# Patient Record
Sex: Female | Born: 1990 | Race: Black or African American | Hispanic: No | Marital: Married | Smoking: Never smoker
Health system: Southern US, Community
[De-identification: ages and names within clinical notes are randomized; demographics above are authoritative.]

## PROBLEM LIST (undated history)

## (undated) DIAGNOSIS — D649 Anemia, unspecified: Secondary | ICD-10-CM

## (undated) DIAGNOSIS — G039 Meningitis, unspecified: Secondary | ICD-10-CM

---
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
## 2012-02-24 ENCOUNTER — Emergency Department: Payer: Self-pay | Admitting: Emergency Medicine

## 2012-02-24 LAB — CBC WITH DIFFERENTIAL/PLATELET
Basophil #: 0 10*3/uL (ref 0.0–0.1)
Basophil %: 0.6 %
Eosinophil #: 0.1 10*3/uL (ref 0.0–0.7)
Eosinophil %: 1 %
HGB: 11.5 g/dL — ABNORMAL LOW (ref 12.0–16.0)
Lymphocyte %: 24.7 %
MCH: 22.7 pg — ABNORMAL LOW (ref 26.0–34.0)
MCHC: 32.3 g/dL (ref 32.0–36.0)
MCV: 70 fL — ABNORMAL LOW (ref 80–100)
Monocyte %: 6.9 %
Neutrophil #: 5.4 10*3/uL (ref 1.4–6.5)
Platelet: 421 10*3/uL (ref 150–440)
RBC: 5.05 10*6/uL (ref 3.80–5.20)
RDW: 16.7 % — ABNORMAL HIGH (ref 11.5–14.5)

## 2012-02-24 LAB — URINALYSIS, COMPLETE
Bacteria: NONE SEEN
Bilirubin,UR: NEGATIVE
Blood: NEGATIVE
Glucose,UR: NEGATIVE mg/dL (ref 0–75)
Ketone: NEGATIVE
Nitrite: NEGATIVE
RBC,UR: 2 /HPF (ref 0–5)
WBC UR: 4 /HPF (ref 0–5)

## 2012-02-24 LAB — COMPREHENSIVE METABOLIC PANEL
Albumin: 4.4 g/dL (ref 3.8–5.6)
Anion Gap: 8 (ref 7–16)
Chloride: 107 mmol/L (ref 98–107)
Potassium: 3.8 mmol/L (ref 3.5–5.1)
SGOT(AST): 26 U/L (ref 0–26)
SGPT (ALT): 21 U/L
Sodium: 142 mmol/L (ref 136–145)

## 2012-02-24 LAB — LIPASE, BLOOD: Lipase: 129 U/L (ref 73–393)

## 2012-10-26 ENCOUNTER — Emergency Department: Payer: Self-pay | Admitting: Emergency Medicine

## 2012-10-26 LAB — HCG, QUANTITATIVE, PREGNANCY: Beta Hcg, Quant.: 2080 m[IU]/mL — ABNORMAL HIGH

## 2012-10-26 LAB — COMPREHENSIVE METABOLIC PANEL
Anion Gap: 6 — ABNORMAL LOW (ref 7–16)
BUN: 8 mg/dL (ref 7–18)
Bilirubin,Total: 0.5 mg/dL (ref 0.2–1.0)
Calcium, Total: 8.8 mg/dL (ref 8.5–10.1)
Chloride: 105 mmol/L (ref 98–107)
Creatinine: 0.69 mg/dL (ref 0.60–1.30)
EGFR (African American): >60
EGFR (Non-African Amer.): >60
Glucose: 90 mg/dL (ref 65–99)
Potassium: 3.6 mmol/L (ref 3.5–5.1)
Sodium: 136 mmol/L (ref 136–145)

## 2012-10-26 LAB — URINALYSIS, COMPLETE
Bacteria: NONE SEEN
Glucose,UR: NEGATIVE mg/dL (ref 0–75)
Nitrite: NEGATIVE
Ph: 6 (ref 4.5–8.0)
Protein: NEGATIVE
RBC,UR: 2 /HPF (ref 0–5)
Specific Gravity: 1.014 (ref 1.003–1.030)
Squamous Epithelial: 7
WBC UR: 2 /HPF (ref 0–5)

## 2012-10-26 LAB — CBC
HGB: 10.7 g/dL — ABNORMAL LOW (ref 12.0–16.0)
MCHC: 31 g/dL — ABNORMAL LOW (ref 32.0–36.0)
MCV: 68 fL — ABNORMAL LOW (ref 80–100)
Platelet: 504 10*3/uL — ABNORMAL HIGH (ref 150–440)
RBC: 5.09 10*6/uL (ref 3.80–5.20)
WBC: 10 10*3/uL (ref 3.6–11.0)

## 2012-10-27 LAB — WET PREP, GENITAL

## 2015-04-05 ENCOUNTER — Encounter: Payer: Self-pay | Admitting: *Deleted

## 2015-04-05 ENCOUNTER — Emergency Department: Payer: 59

## 2015-04-05 ENCOUNTER — Emergency Department
Admission: EM | Admit: 2015-04-05 | Discharge: 2015-04-05 | Disposition: A | Discharge status: Home / Self Care | Payer: 59 | Attending: Emergency Medicine | Admitting: Emergency Medicine

## 2015-04-05 DIAGNOSIS — Z79899 Other long term (current) drug therapy: Secondary | ICD-10-CM | POA: Diagnosis not present

## 2015-04-05 DIAGNOSIS — Z3A01 Less than 8 weeks gestation of pregnancy: Secondary | ICD-10-CM | POA: Insufficient documentation

## 2015-04-05 DIAGNOSIS — O200 Threatened abortion: Secondary | ICD-10-CM | POA: Insufficient documentation

## 2015-04-05 DIAGNOSIS — N760 Acute vaginitis: Secondary | ICD-10-CM

## 2015-04-05 DIAGNOSIS — O23591 Infection of other part of genital tract in pregnancy, first trimester: Secondary | ICD-10-CM | POA: Insufficient documentation

## 2015-04-05 DIAGNOSIS — O9989 Other specified diseases and conditions complicating pregnancy, childbirth and the puerperium: Secondary | ICD-10-CM | POA: Diagnosis present

## 2015-04-05 DIAGNOSIS — B9689 Other specified bacterial agents as the cause of diseases classified elsewhere: Secondary | ICD-10-CM

## 2015-04-05 LAB — POCT PREGNANCY, URINE: Preg Test, Ur: POSITIVE — AB

## 2015-04-05 LAB — URINALYSIS COMPLETE WITH MICROSCOPIC (ARMC ONLY)
BACTERIA UA: NONE SEEN
Bilirubin Urine: NEGATIVE
Glucose, UA: NEGATIVE mg/dL
HGB URINE DIPSTICK: NEGATIVE
Ketones, ur: NEGATIVE mg/dL
LEUKOCYTES UA: NEGATIVE
Nitrite: NEGATIVE
Protein, ur: NEGATIVE mg/dL
SPECIFIC GRAVITY, URINE: 1.013 (ref 1.005–1.030)
pH: 8 (ref 5.0–8.0)

## 2015-04-05 LAB — CHLAMYDIA/NGC RT PCR (ARMC ONLY)
Chlamydia Tr: NOT DETECTED
N GONORRHOEAE: NOT DETECTED

## 2015-04-05 LAB — WET PREP, GENITAL
TRICH WET PREP: NONE SEEN
YEAST WET PREP: NONE SEEN

## 2015-04-05 LAB — HCG, QUANTITATIVE, PREGNANCY: hCG, Beta Chain, Quant, S: 33105 m[IU]/mL — ABNORMAL HIGH (ref <5)

## 2015-04-05 LAB — ABO/RH: ABO/RH(D): O POS

## 2015-04-05 MED ORDER — METRONIDAZOLE 500 MG PO TABS: 500.0000 mg | ORAL_TABLET | Freq: Two times a day (BID) | ORAL | Status: AC

## 2015-04-05 MED ORDER — METRONIDAZOLE 500 MG PO TABS
500.0000 mg | ORAL_TABLET | Freq: Once | ORAL | Status: AC
  Administered 2015-04-05: 500 mg via ORAL

## 2015-04-05 MED ORDER — METRONIDAZOLE 500 MG PO TABS
ORAL_TABLET | ORAL | Status: AC
  Administered 2015-04-05: 500 mg via ORAL
  Filled 2015-04-05: qty 1

## 2015-04-05 NOTE — ED Notes (Signed)
Patient transported to Ultrasound 

## 2015-04-05 NOTE — ED Notes (Signed)
POC pregnancy positive.

## 2015-04-05 NOTE — ED Notes (Signed)
LMP 5/6.  Pt is [redacted] weeks pregnant and for the past 3 days pt has been spotting and today she has been having cramping.  Cramping increases with activity.  Pt states that she has been having spotting, changing her panty liner about every 2 hours.  Spotting is intermittent

## 2015-04-05 NOTE — ED Provider Notes (Signed)
[facility] Emergency Department Provider Note  ____________________________________________  Time seen: Approximately 725 PM  I have reviewed the triage vital signs and the nursing notes.   HISTORY  Chief Complaint Threatened Miscarriage    HPI Kathryn Frost is a 24 y.o. female who is a G2 P1 who presents today with 3 days of mild lower abdominal cramping with vaginal spotting. She says that she is about 7 weeks' pregnant and will be having obstetric care with [facility]. She says she has an appointment in one week and is already taking prenatal vitamins. She has a history of chlamydia that was treated this past February. No history of ectopic pregnancy. Not passing any tissue. No complaints of vaginal discharge or dysuria. No cramping at this time.   History reviewed. No pertinent past medical history.  There are no active problems to display for this patient.   History reviewed. No pertinent past surgical history.  Current Outpatient Rx  Name  Route  Sig  Dispense  Refill  . prenatal vitamin w/FE, FA (PRENATAL 1 + 1) 27-1 MG TABS tablet   Oral   Take 1 tablet by mouth daily.           Allergies Review of patient's allergies indicates no known allergies.  No family history on file.  Social History History  Substance Use Topics  . Smoking status: Never Smoker   . Smokeless tobacco: Not on file  . Alcohol Use: No    Review of Systems Constitutional: No fever/chills Eyes: No visual changes. ENT: No sore throat. Cardiovascular: Denies chest pain. Respiratory: Denies shortness of breath. Gastrointestinal: No abdominal pain.  No nausea, no vomiting.  No diarrhea.  No constipation. Genitourinary: Negative for dysuria. Musculoskeletal: Negative for back pain. Skin: Negative for rash. Neurological: Negative for headaches, focal weakness or numbness.  10-point ROS otherwise  negative.  ____________________________________________   PHYSICAL EXAM:  VITAL SIGNS: ED Triage Vitals  Enc Vitals Group     BP 04/05/15 1729 102/58 mmHg     Pulse Rate 04/05/15 1729 71     Resp 04/05/15 1729 20     Temp 04/05/15 1729 98.1 F (36.7 C)     Temp Source 04/05/15 1729 Oral     SpO2 04/05/15 1729 100 %     Weight 04/05/15 1729 130 lb (58.968 kg)     Height 04/05/15 1729  (1.702 m)     Head Cir --      Peak Flow --      Pain Score 04/05/15 1730 4     Pain Loc --      Pain Edu? --      Excl. in GC? --     Constitutional: Alert and oriented. Well appearing and in no acute distress. Eyes: Conjunctivae are normal. PERRL. EOMI. Head: Atraumatic. Nose: No congestion/rhinnorhea. Mouth/Throat: Mucous membranes are moist.  Oropharynx non-erythematous. Neck: No stridor.   Cardiovascular: Normal rate, regular rhythm. Grossly normal heart sounds.  Good peripheral circulation. Respiratory: Normal respiratory effort.  No retractions. Lungs CTAB. Gastrointestinal: Soft and nontender. No distention. No abdominal bruits. No CVA tenderness. Genitourinary: Normal external exam. Speculum exam with small amount of yellow discharge. Bimanual exam with closed cervix. There is no CMT. There is no uterine or adnexal tenderness. There are no masses palpated. Musculoskeletal: No lower extremity tenderness nor edema.  No joint effusions. Neurologic:  Normal speech and language. No gross focal neurologic deficits are appreciated. Speech is normal. No gait instability. Skin:  Skin is warm, dry and intact. No rash noted. Psychiatric: Mood and affect are normal. Speech and behavior are normal.  ____________________________________________   LABS (all labs ordered are listed, but only abnormal results are displayed)  Labs Reviewed  WET PREP, GENITAL - Abnormal; Notable for the following:    Clue Cells Wet Prep HPF POC FEW (*)    WBC, Wet Prep HPF POC MODERATE (*)    All other  components within normal limits  HCG, QUANTITATIVE, PREGNANCY - Abnormal; Notable for the following:    hCG, Beta Chain, Quant, S 33105 (*)    All other components within normal limits  URINALYSIS COMPLETEWITH MICROSCOPIC ([facility] ONLY) - Abnormal; Notable for the following:    Color, Urine YELLOW (*)    APPearance CLEAR (*)    Squamous Epithelial / LPF 0-5 (*)    All other components within normal limits  POCT PREGNANCY, URINE - Abnormal; Notable for the following:    Preg Test, Ur POSITIVE (*)    All other components within normal limits  CHLAMYDIA/NGC RT PCR ([facility] ONLY)  POC URINE PREG, ED  ABO/RH  ABO/RH   ____________________________________________  EKG   ____________________________________________  RADIOLOGY  Single live intrauterine gestation 6 weeks and 6 days ____________________________________________   PROCEDURES    ____________________________________________   INITIAL IMPRESSION / ASSESSMENT AND PLAN / ED COURSE  Pertinent labs & imaging results that were available during my care of the patient were reviewed by me and considered in my medical decision making (see chart for details).  ----------------------------------------- 9:50 PM on 04/05/2015 -----------------------------------------  Patient resting comfortably. We'll discharge to home. Patient to continue prenatal vitamins. Will follow-up at [facility] this Wednesday for her prenatal care. We will treat bacterial vaginosis with Flagyl. Patient counseled and has had BV 1 time in the past as well. ____________________________________________   FINAL CLINICAL IMPRESSION(S) / ED DIAGNOSES  Acute threatened abortion. Acute bacterial vaginosis. Initial visit.    Myrna Blazer, MD 04/05/15 2150

## 2015-04-06 LAB — ABO/RH: ABO/RH(D): O POS

## 2017-12-21 ENCOUNTER — Emergency Department: Payer: 59

## 2017-12-21 ENCOUNTER — Encounter: Payer: Self-pay | Admitting: Emergency Medicine

## 2017-12-21 ENCOUNTER — Emergency Department
Admission: EM | Admit: 2017-12-21 | Discharge: 2017-12-21 | Disposition: A | Discharge status: Home / Self Care | Payer: 59 | Attending: Emergency Medicine | Admitting: Emergency Medicine

## 2017-12-21 ENCOUNTER — Other Ambulatory Visit: Payer: Self-pay

## 2017-12-21 DIAGNOSIS — R509 Fever, unspecified: Secondary | ICD-10-CM | POA: Diagnosis present

## 2017-12-21 DIAGNOSIS — J020 Streptococcal pharyngitis: Secondary | ICD-10-CM

## 2017-12-21 LAB — GROUP A STREP BY PCR: Group A Strep by PCR: DETECTED — AB

## 2017-12-21 LAB — POCT PREGNANCY, URINE: PREG TEST UR: NEGATIVE

## 2017-12-21 LAB — INFLUENZA PANEL BY PCR (TYPE A & B)
INFLBPCR: NEGATIVE
Influenza A By PCR: NEGATIVE

## 2017-12-21 LAB — URINALYSIS, COMPLETE (UACMP) WITH MICROSCOPIC
BACTERIA UA: NONE SEEN
Bilirubin Urine: NEGATIVE
Glucose, UA: NEGATIVE mg/dL
Hgb urine dipstick: NEGATIVE
Ketones, ur: NEGATIVE mg/dL
Nitrite: NEGATIVE
Protein, ur: NEGATIVE mg/dL
SPECIFIC GRAVITY, URINE: 1.019 (ref 1.005–1.030)
pH: 6 (ref 5.0–8.0)

## 2017-12-21 MED ORDER — ONDANSETRON 4 MG PO TBDP: 4.0000 mg | ORAL_TABLET | Freq: Three times a day (TID) | ORAL | 0 refills | Status: DC | PRN

## 2017-12-21 MED ORDER — TRAMADOL HCL 50 MG PO TABS: 50.0000 mg | ORAL_TABLET | Freq: Four times a day (QID) | ORAL | 0 refills | Status: DC | PRN

## 2017-12-21 MED ORDER — IBUPROFEN 800 MG PO TABS
800.0000 mg | ORAL_TABLET | Freq: Once | ORAL | Status: AC
  Administered 2017-12-21: 800 mg via ORAL
  Filled 2017-12-21: qty 1

## 2017-12-21 MED ORDER — AMOXICILLIN 875 MG PO TABS: 875.0000 mg | ORAL_TABLET | Freq: Two times a day (BID) | ORAL | 0 refills | Status: DC

## 2017-12-21 MED ORDER — ONDANSETRON 4 MG PO TBDP
4.0000 mg | ORAL_TABLET | Freq: Once | ORAL | Status: AC
  Administered 2017-12-21: 4 mg via ORAL
  Filled 2017-12-21: qty 1

## 2017-12-21 NOTE — ED Notes (Signed)
See triage note  States she developed body aches,fever and headache about 1 1/2 weeks ago  sxs' eased off and then returned on Friday  Pt is febrile on arrival

## 2017-12-21 NOTE — ED Provider Notes (Signed)
[facility] Emergency Department Provider Note  ____________________________________________   First MD Initiated Contact with Patient 12/21/17 1045     (approximate)  I have reviewed the triage vital signs and the nursing notes.   HISTORY  Chief Complaint Chills; sinus congestion; and Back Pain    HPI Kathryn Frost is a 27 y.o. female resents emergency department complaining of fever and chills with body aches, headache for about 1-1/2 weeks.  She states she had same symptoms last week but tested negative for the flu.  Now she has a cough.,  Sore throat, nausea, and diarrhea.  She states she did have some urinary frequency and burning this morning.  She states she is otherwise healthy.  She states all of her children and her husband both had the flu  History reviewed. No pertinent past medical history.  There are no active problems to display for this patient.   History reviewed. No pertinent surgical history.  Prior to Admission medications   Medication Sig Start Date End Date Taking? Authorizing Provider  amoxicillin (AMOXIL) 875 MG tablet Take 1 tablet (875 mg total) by mouth 2 (two) times daily. 12/21/17   Fisher, Roselyn Bering, PA-C  ondansetron (ZOFRAN-ODT) 4 MG disintegrating tablet Take 1 tablet (4 mg total) by mouth every 8 (eight) hours as needed for nausea or vomiting. 12/21/17   Sherrie Mustache Roselyn Bering, PA-C  traMADol (ULTRAM) 50 MG tablet Take 1 tablet (50 mg total) by mouth every 6 (six) hours as needed. 12/21/17   Faythe Ghee, PA-C    Allergies Patient has no known allergies.  No family history on file.  Social History Social History   Tobacco Use  . Smoking status: Never Smoker  . Smokeless tobacco: Never Used  Substance Use Topics  . Alcohol use: No  . Drug use: Not on file    Review of Systems  Constitutional: Positive fever/chills Eyes: No visual changes. ENT: Positive sore throat. Respiratory: Positive cough Gastrointestinal:  Positive for nausea, positive for diarrhea Genitourinary: Positive for dysuria. Musculoskeletal: Positive for back pain. Skin: Negative for rash.    ____________________________________________   PHYSICAL EXAM:  VITAL SIGNS: ED Triage Vitals  Enc Vitals Group     BP 12/21/17 1028 125/66     Pulse Rate 12/21/17 1028 89     Resp 12/21/17 1028 18     Temp 12/21/17 1028 (!) 101.1 F (38.4 C)     Temp Source 12/21/17 1028 Oral     SpO2 12/21/17 1028 99 %     Weight 12/21/17 1027 132 lb (59.9 kg)     Height 12/21/17 1027 5\' 7"  (1.702 m)     Head Circumference --      Peak Flow --      Pain Score 12/21/17 1027 7     Pain Loc --      Pain Edu? --      Excl. in GC? --     Constitutional: Alert and oriented.  Tired appearing and in no acute distress.  Nontoxic Eyes: Conjunctivae are normal.  Head: Atraumatic. Ears: TMs clear bilaterally Nose: No congestion/rhinnorhea. Mouth/Throat: Mucous membranes are moist.  Throat with enlarged tonsils Cardiovascular: Normal rate, regular rhythm.  Sounds are normal Respiratory: Normal respiratory effort.  No retractions, lungs clear to auscultation Abdomen: Soft nontender bowel sounds normal GU: deferred Musculoskeletal: FROM all extremities, warm and well perfused.  The mid back is tender around the ribs.  Positive CVA tenderness Neurologic:  Normal speech and  language.  Skin:  Skin is warm, dry and intact. No rash noted. Psychiatric: Mood and affect are normal. Speech and behavior are normal.  ____________________________________________   LABS (all labs ordered are listed, but only abnormal results are displayed)  Labs Reviewed  GROUP A STREP BY PCR - Abnormal; Notable for the following components:      Result Value   Group A Strep by PCR DETECTED (*)    All other components within normal limits  URINALYSIS, COMPLETE (UACMP) WITH MICROSCOPIC - Abnormal; Notable for the following components:   Color, Urine YELLOW (*)     APPearance CLOUDY (*)    Leukocytes, UA TRACE (*)    Squamous Epithelial / LPF TOO NUMEROUS TO COUNT (*)    All other components within normal limits  INFLUENZA PANEL BY PCR (TYPE A & B)  POC URINE PREG, ED  POCT PREGNANCY, URINE   ____________________________________________   ____________________________________________  RADIOLOGY  Chest x-ray is negative for pneumonia  ____________________________________________   PROCEDURES  Procedure(s) performed: No  Procedures    ____________________________________________   INITIAL IMPRESSION / ASSESSMENT AND PLAN / ED COURSE  Pertinent labs & imaging results that were available during my care of the patient were reviewed by me and considered in my medical decision making (see chart for details).  Patient has a 27 year old female complaining of flulike symptoms with a headache, cough and dysuria, some nausea and diarrhea  On physical exam the patient appears ill but nontoxic she is febrile.  Lungs with decreased breath sounds.  Abdomen is soft nontender  Flu test, strep test, UA, chest x-ray    ----------------------------------------- 3:48 PM on 12/21/2017 -----------------------------------------  Chest x-ray is negative, flu test is negative, strep test is positive, UA is negative  X-ray lab results were discussed with the patient.  She was given a prescription for amoxicillin 875 twice daily for 10 days.  She is to follow-up with her regular doctor if not better in 3-5 days.  She take Tylenol and ibuprofen as needed for pain and fever.  She is to drink plenty of fluids.  Patient states she understands comply with instructions.  She was discharged in stable condition  As part of my medical decision making, I reviewed the following data within the electronic MEDICAL RECORD NUMBER Nursing notes reviewed and incorporated, Labs reviewed labs as above, Radiograph reviewed chest x-ray is negative, Notes from prior ED visits and [place]  Controlled Substance Database  ____________________________________________   FINAL CLINICAL IMPRESSION(S) / ED DIAGNOSES  Final diagnoses:  Strep throat      NEW MEDICATIONS STARTED DURING THIS VISIT:  Discharge Medication List as of 12/21/2017 12:23 PM    START taking these medications   Details  amoxicillin (AMOXIL) 875 MG tablet Take 1 tablet (875 mg total) by mouth 2 (two) times daily., Starting Mon 12/21/2017, Print    ondansetron (ZOFRAN-ODT) 4 MG disintegrating tablet Take 1 tablet (4 mg total) by mouth every 8 (eight) hours as needed for nausea or vomiting., Starting Mon 12/21/2017, Print    traMADol (ULTRAM) 50 MG tablet Take 1 tablet (50 mg total) by mouth every 6 (six) hours as needed., Starting Mon 12/21/2017, Print         Note:  This document was prepared using Dragon voice recognition software and may include unintentional dictation errors.    Faythe GheeFisher, Susan W, PA-C 12/21/17 1549    Jene EveryKinner, Robert, MD 12/22/17 [id number]

## 2017-12-21 NOTE — ED Triage Notes (Signed)
C/o chills, congestion, back pain since Friday. States had similar symptoms last week, but resolved.

## 2017-12-21 NOTE — Discharge Instructions (Signed)
Follow-up with your regular doctor if not better in 3 days.  Use medication as prescribed.  Drink plenty of fluids.  Be sure to finish all of the antibiotic as it will take 10 days to clear out strep.  You should not return to work until he been fever free for 24 hours.  Discard your toothbrush in 3 days so you will not reinfect yourself.  If you are worsening and cannot swallow liquids please return the emergency department

## 2019-05-06 ENCOUNTER — Other Ambulatory Visit: Payer: Self-pay

## 2019-05-06 ENCOUNTER — Emergency Department
Admission: EM | Admit: 2019-05-06 | Discharge: 2019-05-06 | Disposition: A | Discharge status: Home / Self Care | Payer: BC Managed Care – PPO | Attending: Emergency Medicine | Admitting: Emergency Medicine

## 2019-05-06 ENCOUNTER — Encounter: Payer: Self-pay | Admitting: Emergency Medicine

## 2019-05-06 DIAGNOSIS — R51 Headache: Secondary | ICD-10-CM | POA: Diagnosis present

## 2019-05-06 DIAGNOSIS — Z20828 Contact with and (suspected) exposure to other viral communicable diseases: Secondary | ICD-10-CM | POA: Diagnosis not present

## 2019-05-06 DIAGNOSIS — R110 Nausea: Secondary | ICD-10-CM | POA: Diagnosis not present

## 2019-05-06 LAB — URINALYSIS, COMPLETE (UACMP) WITH MICROSCOPIC
Bacteria, UA: NONE SEEN
Bilirubin Urine: NEGATIVE
Glucose, UA: NEGATIVE mg/dL
Ketones, ur: NEGATIVE mg/dL
Leukocytes,Ua: NEGATIVE
Nitrite: NEGATIVE
Protein, ur: NEGATIVE mg/dL
Specific Gravity, Urine: 1.025 (ref 1.005–1.030)
pH: 6 (ref 5.0–8.0)

## 2019-05-06 LAB — COMPREHENSIVE METABOLIC PANEL
ALT: 16 U/L (ref 0–44)
AST: 21 U/L (ref 15–41)
Albumin: 4.2 g/dL (ref 3.5–5.0)
Alkaline Phosphatase: 60 U/L (ref 38–126)
Anion gap: 7 (ref 5–15)
BUN: 14 mg/dL (ref 6–20)
CO2: 25 mmol/L (ref 22–32)
Calcium: 8.7 mg/dL — ABNORMAL LOW (ref 8.9–10.3)
Chloride: 106 mmol/L (ref 98–111)
Creatinine, Ser: 0.72 mg/dL (ref 0.44–1.00)
GFR calc Af Amer: >60 mL/min (ref >60)
GFR calc non Af Amer: >60 mL/min (ref >60)
Glucose, Bld: 98 mg/dL (ref 70–99)
Potassium: 4 mmol/L (ref 3.5–5.1)
Sodium: 138 mmol/L (ref 135–145)
Total Bilirubin: 0.7 mg/dL (ref 0.3–1.2)
Total Protein: 7.7 g/dL (ref 6.5–8.1)

## 2019-05-06 LAB — CBC
HCT: 36.9 % (ref 36.0–46.0)
Hemoglobin: 12.2 g/dL (ref 12.0–15.0)
MCH: 28.8 pg (ref 26.0–34.0)
MCHC: 33.1 g/dL (ref 30.0–36.0)
MCV: 87.2 fL (ref 80.0–100.0)
Platelets: 316 10*3/uL (ref 150–400)
RBC: 4.23 MIL/uL (ref 3.87–5.11)
RDW: 13.4 % (ref 11.5–15.5)
WBC: 6.3 10*3/uL (ref 4.0–10.5)
nRBC: 0 % (ref 0.0–0.2)

## 2019-05-06 LAB — LIPASE, BLOOD: Lipase: 36 U/L (ref 11–51)

## 2019-05-06 LAB — POCT PREGNANCY, URINE: Preg Test, Ur: NEGATIVE

## 2019-05-06 MED ORDER — FAMOTIDINE 20 MG PO TABS: 20.0000 mg | ORAL_TABLET | Freq: Two times a day (BID) | ORAL | 0 refills | Status: DC

## 2019-05-06 MED ORDER — SODIUM CHLORIDE 0.9% FLUSH: 3.0000 mL | Freq: Once | INTRAVENOUS | Status: DC

## 2019-05-06 MED ORDER — ONDANSETRON 4 MG PO TBDP
8.0000 mg | ORAL_TABLET | Freq: Once | ORAL | Status: AC
  Administered 2019-05-06: 18:00:00 8 mg via ORAL
  Filled 2019-05-06: qty 2

## 2019-05-06 MED ORDER — ONDANSETRON 4 MG PO TBDP: 4.0000 mg | ORAL_TABLET | Freq: Three times a day (TID) | ORAL | 0 refills | Status: DC | PRN

## 2019-05-06 NOTE — ED Triage Notes (Signed)
Pt to ED with c/o of headache, abdominal pain and nausea for approx 1 week. Pt state diarrhea on Monday but denies vomiting.

## 2019-05-06 NOTE — ED Notes (Signed)
Pt given ginger ale.

## 2019-05-06 NOTE — Discharge Instructions (Signed)
Person Under Monitoring Name: Kathryn Frost  Location: [place] [address]   Infection Prevention Recommendations for Individuals Confirmed to have, or Being Evaluated for, 2019 Novel Coronavirus (COVID-19) Infection Who Receive Care at Home  Individuals who are confirmed to have, or are being evaluated for, COVID-19 should follow the prevention steps below until a healthcare provider or local or state health department says they can return to normal activities.  Stay home except to get medical care You should restrict activities outside your home, except for getting medical care. Do not go to work, school, or public areas, and do not use public transportation or taxis.  Call ahead before visiting your doctor Before your medical appointment, call the healthcare provider and tell them that you have, or are being evaluated for, COVID-19 infection. This will help the healthcare providers office take steps to keep other people from getting infected. Ask your healthcare provider to call the local or state health department.  Monitor your symptoms Seek prompt medical attention if your illness is worsening (e.g., difficulty breathing). Before going to your medical appointment, call the healthcare provider and tell them that you have, or are being evaluated for, COVID-19 infection. Ask your healthcare provider to call the local or state health department.  Wear a facemask You should wear a facemask that covers your nose and mouth when you are in the same room with other people and when you visit a healthcare provider. People who live with or visit you should also wear a facemask while they are in the same room with you.  Separate yourself from other people in your home As much as possible, you should stay in a different room from other people in your home. Also, you should use a separate bathroom, if available.  Avoid sharing household items You should not  share dishes, drinking glasses, cups, eating utensils, towels, bedding, or other items with other people in your home. After using these items, you should wash them thoroughly with soap and water.  Cover your coughs and sneezes Cover your mouth and nose with a tissue when you cough or sneeze, or you can cough or sneeze into your sleeve. Throw used tissues in a lined trash can, and immediately wash your hands with soap and water for at least 20 seconds or use an alcohol-based hand rub.  Wash your [facility] your hands often and thoroughly with soap and water for at least 20 seconds. You can use an alcohol-based hand sanitizer if soap and water are not available and if your hands are not visibly dirty. Avoid touching your eyes, nose, and mouth with unwashed hands.   Prevention Steps for Caregivers and Household Members of Individuals Confirmed to have, or Being Evaluated for, COVID-19 Infection Being Cared for in the Home  If you live with, or provide care at home for, a person confirmed to have, or being evaluated for, COVID-19 infection please follow these guidelines to prevent infection:  Follow healthcare providers instructions Make sure that you understand and can help the patient follow any healthcare provider instructions for all care.  Provide for the patients basic needs You should help the patient with basic needs in the home and provide support for getting groceries, prescriptions, and other personal needs.  Monitor the patients symptoms If they are getting sicker, call his or her medical provider and tell them that the patient has, or is being evaluated for, COVID-19 infection. This will help the healthcare providers  office take steps to keep other people from getting infected. Ask the healthcare provider to call the local or state health department.  Limit the number of people who have contact with the patient If possible, have only one caregiver for the  patient. Other household members should stay in another home or place of residence. If this is not possible, they should stay in another room, or be separated from the patient as much as possible. Use a separate bathroom, if available. Restrict visitors who do not have an essential need to be in the home.  Keep older adults, very young children, and other sick people away from the patient Keep older adults, very young children, and those who have compromised immune systems or chronic health conditions away from the patient. This includes people with chronic heart, lung, or kidney conditions, diabetes, and cancer.  Ensure good ventilation Make sure that shared spaces in the home have good air flow, such as from an air conditioner or an opened window, weather permitting.  Wash your hands often Wash your hands often and thoroughly with soap and water for at least 20 seconds. You can use an alcohol based hand sanitizer if soap and water are not available and if your hands are not visibly dirty. Avoid touching your eyes, nose, and mouth with unwashed hands. Use disposable paper towels to dry your hands. If not available, use dedicated cloth towels and replace them when they become wet.  Wear a facemask and gloves Wear a disposable facemask at all times in the room and gloves when you touch or have contact with the patients blood, body fluids, and/or secretions or excretions, such as sweat, saliva, sputum, nasal mucus, vomit, urine, or feces.  Ensure the mask fits over your nose and mouth tightly, and do not touch it during use. Throw out disposable facemasks and gloves after using them. Do not reuse. Wash your hands immediately after removing your facemask and gloves. If your personal clothing becomes contaminated, carefully remove clothing and launder. Wash your hands after handling contaminated clothing. Place all used disposable facemasks, gloves, and other waste in a lined container before  disposing them with other household waste. Remove gloves and wash your hands immediately after handling these items.  Do not share dishes, glasses, or other household items with the patient Avoid sharing household items. You should not share dishes, drinking glasses, cups, eating utensils, towels, bedding, or other items with a patient who is confirmed to have, or being evaluated for, COVID-19 infection. After the person uses these items, you should wash them thoroughly with soap and water.  Wash laundry thoroughly Immediately remove and wash clothes or bedding that have blood, body fluids, and/or secretions or excretions, such as sweat, saliva, sputum, nasal mucus, vomit, urine, or feces, on them. Wear gloves when handling laundry from the patient. Read and follow directions on labels of laundry or clothing items and detergent. In general, wash and dry with the warmest temperatures recommended on the label.  Clean all areas the individual has used often Clean all touchable surfaces, such as counters, tabletops, doorknobs, bathroom fixtures, toilets, phones, keyboards, tablets, and bedside tables, every day. Also, clean any surfaces that may have blood, body fluids, and/or secretions or excretions on them. Wear gloves when cleaning surfaces the patient has come in contact with. Use a diluted bleach solution (e.g., dilute bleach with 1 part bleach and 10 parts water) or a household disinfectant with a label that says EPA-registered for coronaviruses. To make a  bleach solution at home, add 1 tablespoon of bleach to 1 quart (4 cups) of water. For a larger supply, add  cup of bleach to 1 gallon (16 cups) of water. Read labels of cleaning products and follow recommendations provided on product labels. Labels contain instructions for safe and effective use of the cleaning product including precautions you should take when applying the product, such as wearing gloves or eye protection and making sure you  have good ventilation during use of the product. Remove gloves and wash hands immediately after cleaning.  Monitor yourself for signs and symptoms of illness Caregivers and household members are considered close contacts, should monitor their health, and will be asked to limit movement outside of the home to the extent possible. Follow the monitoring steps for close contacts listed on the symptom monitoring form.   ? If you have additional questions, contact your local health department or call the epidemiologist on call at [phone] (available 24/7). ? This guidance is subject to change. For the most up-to-date guidance from [facility], please refer to their website: [email or web address]

## 2019-05-06 NOTE — ED Provider Notes (Signed)
[facility] Emergency Department Provider Note  ____________________________________________  Time seen: Approximately 5:55 PM  I have reviewed the triage vital signs and the nursing notes.   HISTORY  Chief Complaint Headache and Abdominal Pain    HPI Kathryn Frost is a 28 y.o. female with no significant past medical history who complains of intermittent headache, upper abdominal discomfort, nausea for the past 5 days.  Currently resolved.   She also had a few episodes of loose stools about 3 days ago.  Denies fever chills sweats or body aches.  She has had exposure to coronavirus through work contacts as well as family contacts.  Denies chest pain or shortness of breath, no dyspnea on exertion.  No neck stiffness. she is eating okay but does have decreased appetite.     History reviewed. No pertinent past medical history.   There are no active problems to display for this patient.    History reviewed. No pertinent surgical history.   Prior to Admission medications   Medication Sig Start Date End Date Taking? Authorizing Provider  amoxicillin (AMOXIL) 875 MG tablet Take 1 tablet (875 mg total) by mouth 2 (two) times daily. 12/21/17   Fisher, Roselyn BeringSusan W, PA-C  famotidine (PEPCID) 20 MG tablet Take 1 tablet (20 mg total) by mouth 2 (two) times daily. 05/06/19   Sharman CheekStafford, Elyssia Strausser, MD  ondansetron (ZOFRAN ODT) 4 MG disintegrating tablet Take 1 tablet (4 mg total) by mouth every 8 (eight) hours as needed for nausea or vomiting. 05/06/19   Sharman CheekStafford, Nile Prisk, MD  ondansetron (ZOFRAN-ODT) 4 MG disintegrating tablet Take 1 tablet (4 mg total) by mouth every 8 (eight) hours as needed for nausea or vomiting. 12/21/17   Sherrie MustacheFisher, Roselyn BeringSusan W, PA-C  traMADol (ULTRAM) 50 MG tablet Take 1 tablet (50 mg total) by mouth every 6 (six) hours as needed. 12/21/17   Faythe GheeFisher, Susan W, PA-C     Allergies Patient has no known allergies.   History reviewed. No pertinent family  history.  Social History Social History   Tobacco Use  . Smoking status: Never Smoker  . Smokeless tobacco: Never Used  Substance Use Topics  . Alcohol use: No  . Drug use: Not on file    Review of Systems  Constitutional:   No fever or chills.  ENT:   No sore throat. No rhinorrhea. Cardiovascular:   No chest pain or syncope. Respiratory:   No dyspnea or cough. Gastrointestinal:   Positive for episodic upper abdominal pain without vomiting and diarrhea.  Musculoskeletal:   Negative for focal pain or swelling All other systems reviewed and are negative except as documented above in ROS and HPI.  ____________________________________________   PHYSICAL EXAM:  VITAL SIGNS: ED Triage Vitals  Enc Vitals Group     BP 05/06/19 1516 111/75     Pulse Rate 05/06/19 1516 72     Resp 05/06/19 1516 18     Temp 05/06/19 1516 98.3 F (36.8 C)     Temp Source 05/06/19 1516 Oral     SpO2 05/06/19 1516 96 %     Weight 05/06/19 1520 143 lb (64.9 kg)     Height 05/06/19 1520 5\' 7"  (1.702 m)     Head Circumference --      Peak Flow --      Pain Score 05/06/19 1519 8     Pain Loc --      Pain Edu? --      Excl. in GC? --  Vital signs reviewed, nursing assessments reviewed.   Constitutional:   Alert and oriented. Non-toxic appearance. Eyes:   Conjunctivae are normal. EOMI. PERRL. ENT      Head:   Normocephalic and atraumatic.         Neck:   No meningismus. Full ROM. Hematological/Lymphatic/Immunilogical:   No cervical lymphadenopathy. Cardiovascular:   RRR. Symmetric bilateral radial and DP pulses.  No murmurs. Cap refill less than 2 seconds. Respiratory:   Normal respiratory effort without tachypnea/retractions. Breath sounds are clear and equal bilaterally. No wheezes/rales/rhonchi. Gastrointestinal:   Soft and nontender. Non distended. There is no CVA tenderness.  No rebound, rigidity, or guarding.  Musculoskeletal:   Normal range of motion in all extremities. No joint  effusions.  No lower extremity tenderness.  No edema. Neurologic:   Normal speech and language.  Motor grossly intact. No acute focal neurologic deficits are appreciated.  Skin:    Skin is warm, dry and intact. No rash noted.  No petechiae, purpura, or bullae.  ____________________________________________    LABS (pertinent positives/negatives) (all labs ordered are listed, but only abnormal results are displayed) Labs Reviewed  COMPREHENSIVE METABOLIC PANEL - Abnormal; Notable for the following components:      Result Value   Calcium 8.7 (*)    All other components within normal limits  URINALYSIS, COMPLETE (UACMP) WITH MICROSCOPIC - Abnormal; Notable for the following components:   Color, Urine YELLOW (*)    APPearance CLEAR (*)    Hgb urine dipstick SMALL (*)    All other components within normal limits  NOVEL CORONAVIRUS, NAA (HOSPITAL ORDER, SEND-OUT TO REF LAB)  LIPASE, BLOOD  CBC  POC URINE PREG, ED  POCT PREGNANCY, URINE   ____________________________________________   EKG    ____________________________________________    RADIOLOGY  No results found.  ____________________________________________   PROCEDURES Procedures  ____________________________________________    CLINICAL IMPRESSION / ASSESSMENT AND PLAN / ED COURSE  Medications ordered in the ED: Medications  sodium chloride flush (NS) 0.9 % injection 3 mL (has no administration in time range)  ondansetron (ZOFRAN-ODT) disintegrating tablet 8 mg (8 mg Oral Given 05/06/19 1732)    Pertinent labs & imaging results that were available during my care of the patient were reviewed by me and considered in my medical decision making (see chart for details).  REAGEN GOATES was evaluated in Emergency Department on 05/06/2019 for the symptoms described in the history of present illness. She was evaluated in the context of the global COVID-19 pandemic, which necessitated consideration that the patient  might be at risk for infection with the SARS-CoV-2 virus that causes COVID-19. Institutional protocols and algorithms that pertain to the evaluation of patients at risk for COVID-19 are in a state of rapid change based on information released by regulatory bodies including the CDC and federal and state organizations. These policies and algorithms were followed during the patient's care in the ED.   Patient is nontoxic, vital signs are normal, exam is reassuring.  She has various symptoms which are suggestive of a possible viral illness.  We will test the patient for coronavirus, discharged home to follow-up with primary care.  Zofran and Pepcid for symptom relief at this time.Considering the patient's symptoms, medical history, and physical examination today, I have low suspicion for cholecystitis or biliary pathology, pancreatitis, perforation or bowel obstruction, hernia, intra-abdominal abscess, AAA or dissection, volvulus or intussusception, mesenteric ischemia, or appendicitis.  Doubt meningitis or encephalitis.      ____________________________________________   FINAL  CLINICAL IMPRESSION(S) / ED DIAGNOSES    Final diagnoses:  Nausea     ED Discharge Orders         Ordered    famotidine (PEPCID) 20 MG tablet  2 times daily     05/06/19 1754    ondansetron (ZOFRAN ODT) 4 MG disintegrating tablet  Every 8 hours PRN     05/06/19 1754          Portions of this note were generated with dragon dictation software. Dictation errors may occur despite best attempts at proofreading.   Sharman CheekStafford, Mikeya Tomasetti, MD 05/06/19 [phone]

## 2019-05-10 LAB — NOVEL CORONAVIRUS, NAA (HOSP ORDER, SEND-OUT TO REF LAB; TAT 18-24 HRS): SARS-CoV-2, NAA: NOT DETECTED

## 2020-01-05 ENCOUNTER — Inpatient Hospital Stay (HOSPITAL_COMMUNITY)
Admission: AD | Admit: 2020-01-05 | Discharge: 2020-01-05 | Disposition: A | Discharge status: Home / Self Care | Payer: Medicaid Other | Attending: Obstetrics and Gynecology | Admitting: Obstetrics and Gynecology

## 2020-01-05 ENCOUNTER — Other Ambulatory Visit: Payer: Self-pay

## 2020-01-05 ENCOUNTER — Encounter (HOSPITAL_COMMUNITY): Payer: Self-pay | Admitting: Obstetrics and Gynecology

## 2020-01-05 ENCOUNTER — Encounter (HOSPITAL_COMMUNITY): Payer: Self-pay

## 2020-01-05 ENCOUNTER — Ambulatory Visit (HOSPITAL_COMMUNITY)
Admission: EM | Admit: 2020-01-05 | Discharge: 2020-01-05 | Disposition: A | Discharge status: Home / Self Care | Payer: Self-pay | Attending: Family Medicine | Admitting: Family Medicine

## 2020-01-05 ENCOUNTER — Inpatient Hospital Stay (HOSPITAL_COMMUNITY): Payer: Medicaid Other

## 2020-01-05 DIAGNOSIS — O208 Other hemorrhage in early pregnancy: Secondary | ICD-10-CM | POA: Insufficient documentation

## 2020-01-05 DIAGNOSIS — M545 Low back pain, unspecified: Secondary | ICD-10-CM

## 2020-01-05 DIAGNOSIS — O468X1 Other antepartum hemorrhage, first trimester: Secondary | ICD-10-CM

## 2020-01-05 DIAGNOSIS — O26891 Other specified pregnancy related conditions, first trimester: Secondary | ICD-10-CM | POA: Diagnosis not present

## 2020-01-05 DIAGNOSIS — Z3A01 Less than 8 weeks gestation of pregnancy: Secondary | ICD-10-CM | POA: Insufficient documentation

## 2020-01-05 DIAGNOSIS — Z3490 Encounter for supervision of normal pregnancy, unspecified, unspecified trimester: Secondary | ICD-10-CM

## 2020-01-05 DIAGNOSIS — Z6790 Unspecified blood type, Rh positive: Secondary | ICD-10-CM

## 2020-01-05 DIAGNOSIS — O418X10 Other specified disorders of amniotic fluid and membranes, first trimester, not applicable or unspecified: Secondary | ICD-10-CM

## 2020-01-05 DIAGNOSIS — R109 Unspecified abdominal pain: Secondary | ICD-10-CM

## 2020-01-05 HISTORY — DX: Anemia, unspecified: D64.9

## 2020-01-05 HISTORY — DX: Meningitis, unspecified: G03.9

## 2020-01-05 LAB — COMPREHENSIVE METABOLIC PANEL
ALT: 17 U/L (ref 0–44)
AST: 21 U/L (ref 15–41)
Albumin: 3.9 g/dL (ref 3.5–5.0)
Alkaline Phosphatase: 61 U/L (ref 38–126)
Anion gap: 8 (ref 5–15)
BUN: 9 mg/dL (ref 6–20)
CO2: 23 mmol/L (ref 22–32)
Calcium: 9 mg/dL (ref 8.9–10.3)
Chloride: 108 mmol/L (ref 98–111)
Creatinine, Ser: 0.77 mg/dL (ref 0.44–1.00)
GFR calc Af Amer: >60 mL/min (ref >60)
GFR calc non Af Amer: >60 mL/min (ref >60)
Glucose, Bld: 74 mg/dL (ref 70–99)
Potassium: 3.8 mmol/L (ref 3.5–5.1)
Sodium: 139 mmol/L (ref 135–145)
Total Bilirubin: 1.1 mg/dL (ref 0.3–1.2)
Total Protein: 6.9 g/dL (ref 6.5–8.1)

## 2020-01-05 LAB — POCT URINALYSIS DIP (DEVICE)
Bilirubin Urine: NEGATIVE
Glucose, UA: NEGATIVE mg/dL
Hgb urine dipstick: NEGATIVE
Ketones, ur: NEGATIVE mg/dL
Leukocytes,Ua: NEGATIVE
Nitrite: NEGATIVE
Protein, ur: NEGATIVE mg/dL
Specific Gravity, Urine: 1.02 (ref 1.005–1.030)
Urobilinogen, UA: 0.2 mg/dL (ref 0.0–1.0)
pH: 7 (ref 5.0–8.0)

## 2020-01-05 LAB — WET PREP, GENITAL
Clue Cells Wet Prep HPF POC: NONE SEEN
Sperm: NONE SEEN
Trich, Wet Prep: NONE SEEN
Yeast Wet Prep HPF POC: NONE SEEN

## 2020-01-05 LAB — URINALYSIS, ROUTINE W REFLEX MICROSCOPIC
Bilirubin Urine: NEGATIVE
Glucose, UA: NEGATIVE mg/dL
Hgb urine dipstick: NEGATIVE
Ketones, ur: NEGATIVE mg/dL
Nitrite: NEGATIVE
Protein, ur: NEGATIVE mg/dL
Specific Gravity, Urine: 1.017 (ref 1.005–1.030)
pH: 6 (ref 5.0–8.0)

## 2020-01-05 LAB — CBC
HCT: 36.5 % (ref 36.0–46.0)
Hemoglobin: 12.2 g/dL (ref 12.0–15.0)
MCH: 28.9 pg (ref 26.0–34.0)
MCHC: 33.4 g/dL (ref 30.0–36.0)
MCV: 86.5 fL (ref 80.0–100.0)
Platelets: 294 10*3/uL (ref 150–400)
RBC: 4.22 MIL/uL (ref 3.87–5.11)
RDW: 14.1 % (ref 11.5–15.5)
WBC: 7.7 10*3/uL (ref 4.0–10.5)
nRBC: 0 % (ref 0.0–0.2)

## 2020-01-05 LAB — POCT PREGNANCY, URINE: Preg Test, Ur: POSITIVE — AB

## 2020-01-05 LAB — HCG, QUANTITATIVE, PREGNANCY: hCG, Beta Chain, Quant, S: 5260 m[IU]/mL — ABNORMAL HIGH (ref <5)

## 2020-01-05 LAB — POC URINE PREG, ED: Preg Test, Ur: POSITIVE — AB

## 2020-01-05 MED ORDER — ACETAMINOPHEN 500 MG PO TABS
1000.0000 mg | ORAL_TABLET | Freq: Once | ORAL | Status: AC
  Administered 2020-01-05: 1000 mg via ORAL
  Filled 2020-01-05: qty 2

## 2020-01-05 NOTE — Progress Notes (Signed)
GC/Chlamydia & wet prep cultures obtained & sent to lab by this RN.

## 2020-01-05 NOTE — ED Notes (Signed)
Patient states she is pregnant.

## 2020-01-05 NOTE — Discharge Instructions (Signed)
Subchorionic Hematoma  A subchorionic hematoma is a gathering of blood between the outer wall of the embryo (chorion) and the inner wall of the womb (uterus). This condition can cause vaginal bleeding. If they cause little or no vaginal bleeding, early small hematomas usually shrink on their own and do not affect your baby or pregnancy. When bleeding starts later in pregnancy, or if the hematoma is larger or occurs in older pregnant women, the condition may be more serious. Larger hematomas may get bigger, which increases the chances of miscarriage. This condition also increases the risk of:  Premature separation of the placenta from the uterus.  Premature (preterm) labor.  Stillbirth. What are the causes? The exact cause of this condition is not known. It occurs when blood is trapped between the placenta and the uterine wall because the placenta has separated from the original site of implantation. What increases the risk? You are more likely to develop this condition if:  You were treated with fertility medicines.  You conceived through in vitro fertilization (IVF). What are the signs or symptoms? Symptoms of this condition include:  Vaginal spotting or bleeding.  Contractions of the uterus. These cause abdominal pain. Sometimes you may have no symptoms and the bleeding may only be seen when ultrasound images are taken (transvaginal ultrasound). How is this diagnosed? This condition is diagnosed based on a physical exam. This includes a pelvic exam. You may also have other tests, including:  Blood tests.  Urine tests.  Ultrasound of the abdomen. How is this treated? Treatment for this condition can vary. Treatment may include:  Watchful waiting. You will be monitored closely for any changes in bleeding. During this stage: ? The hematoma may be reabsorbed by the body. ? The hematoma may separate the fluid-filled space containing the embryo (gestational sac) from the wall of the  womb (endometrium).  Medicines.  Activity restriction. This may be needed until the bleeding stops. Follow these instructions at home:  Stay on bed rest if told to do so by your health care provider.  Do not lift anything that is heavier than 10 lbs. (4.5 kg) or as told by your health care provider.  Do not use any products that contain nicotine or tobacco, such as cigarettes and e-cigarettes. If you need help quitting, ask your health care provider.  Track and write down the number of pads you use each day and how soaked (saturated) they are.  Do not use tampons.  Keep all follow-up visits as told by your health care provider. This is important. Your health care provider may ask you to have follow-up blood tests or ultrasound tests or both. Contact a health care provider if:  You have any vaginal bleeding.  You have a fever. Get help right away if:  You have severe cramps in your stomach, back, abdomen, or pelvis.  You pass large clots or tissue. Save any tissue for your health care provider to look at.  You have more vaginal bleeding, and you faint or become lightheaded or weak. Summary  A subchorionic hematoma is a gathering of blood between the outer wall of the placenta and the uterus.  This condition can cause vaginal bleeding.  Sometimes you may have no symptoms and the bleeding may only be seen when ultrasound images are taken.  Treatment may include watchful waiting, medicines, or activity restriction. This information is not intended to replace advice given to you by your health care provider. Make sure you discuss any questions you   have with your health care provider. Document Revised: 09/11/2017 Document Reviewed: 11/25/2016 Elsevier Patient Education  2020 [facility]     Phone: [phone]  [facility] [place] [facility] [place]  Phone: [phone]  [facility] [facility] [place]  Phone: [place] [facility] [employer] [facility] [place]                           Phone: [place] [facility] [place] [facility]          Phone: [phone]  [facility]    Phone: [phone]   [facility])    Phone: [facility]    Phone: [phone]  [facility]    Phone: [place]    Phone: [phone]  [facility]  Phone: [place]               Phone: [phone]  [facility]   Phone: [phone]  [facility]    Phone: [phone]                   Abdominal Pain During Pregnancy  Abdominal pain is common during pregnancy, and has many possible causes. Some causes are more serious than others, and sometimes the cause is not known. Abdominal pain can be a sign that labor is starting. It can also be caused by normal growth and stretching of muscles and ligaments during pregnancy. Always tell your health care provider if you have any abdominal pain. Follow these instructions at home:  Do not have sex or put anything in your vagina until your pain goes away completely.  Get plenty of rest until your pain improves.  Drink enough fluid to keep your urine pale yellow.  Take over-the-counter and prescription medicines only as told by your health care provider.  Keep all follow-up visits as told by your health care provider. This is important. Contact a health care provider if:  Your pain continues or gets worse after resting.  You have lower abdominal pain that: ? Comes and goes at regular intervals. ? Spreads to your back. ? Is similar to menstrual cramps.  You have pain or burning when you urinate. Get help right away if:  You have a fever or  chills.  You have vaginal bleeding.  You are leaking fluid from your vagina.  You are passing tissue from your vagina.  You have vomiting or diarrhea that lasts for more than 24 hours.  Your baby is moving less than usual.  You feel very weak or faint.  You have shortness of breath.  You develop severe pain in your upper abdomen. Summary  Abdominal pain is common during pregnancy, and has many possible causes.  If you experience abdominal pain during pregnancy, tell your health care provider right away.  Follow your health care provider's home care instructions and keep all follow-up visits as directed. This information is not intended to replace advice given to you by your health care provider. Make sure you discuss any questions you have with your health care provider. Document Revised: 01/17/2019 Document Reviewed: 01/01/2017 Elsevier Patient Education  [place].  Safe Medications in Pregnancy    Acne: Benzoyl Peroxide Salicylic Acid  Backache/Headache: Tylenol: 2 regular strength every 4 hours OR              2 Extra strength every 6 hours  Colds/Coughs/Allergies: Benadryl (alcohol free) 25 mg every 6 hours as needed Breath right strips Claritin Cepacol throat lozenges Chloraseptic throat spray Cold-Eeze- up to three times per day Cough drops, alcohol free Flonase (by prescription only) Guaifenesin Mucinex Robitussin DM (plain only, alcohol free) Saline nasal spray/drops Sudafed (pseudoephedrine) & Actifed ** use only after [redacted] weeks gestation and if you do not have high blood pressure Tylenol Vicks Vaporub Zinc lozenges Zyrtec   Constipation: Colace Ducolax suppositories Fleet enema Glycerin suppositories Metamucil Milk of magnesia Miralax Senokot Smooth move tea  Diarrhea: Kaopectate Imodium A-D  *NO pepto Bismol  Hemorrhoids: Anusol Anusol HC Preparation  H Tucks  Indigestion: Tums Maalox Mylanta Zantac  Pepcid  Insomnia: Benadryl (alcohol free)  every 6 hours as needed Tylenol PM Unisom, no Gelcaps  Leg Cramps: Tums MagGel  Nausea/Vomiting:  Bonine Dramamine Emetrol Ginger extract Sea bands Meclizine  Nausea medication to take during pregnancy:  Unisom (doxylamine succinate 25 mg tablets) Take one tablet daily at bedtime. If symptoms are not adequately controlled, the dose can be increased to a maximum recommended dose of two tablets daily (1/2 tablet in the morning, 1/2 tablet mid-afternoon and one at bedtime). Vitamin B6  tablets. Take one tablet twice a day (up to 200 mg per day).  Skin Rashes: Aveeno products Benadryl cream or  every 6 hours as needed Calamine Lotion 1% cortisone cream  Yeast infection: Gyne-lotrimin 7 Monistat 7   **If taking multiple medications, please check labels to avoid duplicating the same active ingredients **take medication as directed on the label ** Do not exceed 4000 mg of tylenol in 24 hours **Do not take medications that contain aspirin or ibuprofen      First Trimester of Pregnancy The first trimester of pregnancy is from week 1 until the end of week 13 (months 1 through 3). A week after a sperm fertilizes an egg, the egg will implant on the wall of the uterus. This embryo will begin to develop into a baby. Genes from you and your partner will form the baby. The female genes will determine whether the baby will be a boy or a girl. At 6-8 weeks, the eyes and face will be formed, and the heartbeat can be seen on ultrasound. At the end of 12 weeks, all the baby's organs will be formed. Now that you are pregnant, you will want to do everything you can to have a healthy baby. Two of the most important things are to get good prenatal care and to follow your health care provider's instructions. Prenatal care is all the medical care you receive before the baby's birth. This  care will help prevent, find, and treat any problems during the pregnancy and childbirth. Body changes during your first trimester Your body goes through many changes during pregnancy. The changes vary from woman to woman.  You may gain or lose a couple of pounds at first.  You may feel sick to your stomach (nauseous) and you may throw up (vomit). If the vomiting is uncontrollable, call your health care provider.  You may tire easily.  You may develop headaches that can be relieved by medicines. All medicines should be approved by your health care provider.  You may urinate more often. Painful urination may  mean you have a bladder infection.  You may develop heartburn as a result of your pregnancy.  You may develop constipation because certain hormones are causing the muscles that push stool through your intestines to slow down.  You may develop hemorrhoids or swollen veins (varicose veins).  Your breasts may begin to grow larger and become tender. Your nipples may stick out more, and the tissue that surrounds them (areola) may become darker.  Your gums may bleed and may be sensitive to brushing and flossing.  Dark spots or blotches (chloasma, mask of pregnancy) may develop on your face. This will likely fade after the baby is born.  Your menstrual periods will stop.  You may have a loss of appetite.  You may develop cravings for certain kinds of food.  You may have changes in your emotions from day to day, such as being excited to be pregnant or being concerned that something may go wrong with the pregnancy and baby.  You may have more vivid and strange dreams.  You may have changes in your hair. These can include thickening of your hair, rapid growth, and changes in texture. Some women also have hair loss during or after pregnancy, or hair that feels dry or thin. Your hair will most likely return to normal after your baby is born. What to expect at prenatal visits During a  routine prenatal visit:  You will be weighed to make sure you and the baby are growing normally.  Your blood pressure will be taken.  Your abdomen will be measured to track your baby's growth.  The fetal heartbeat will be listened to between weeks 10 and 14 of your pregnancy.  Test results from any previous visits will be discussed. Your health care provider may ask you:  How you are feeling.  If you are feeling the baby move.  If you have had any abnormal symptoms, such as leaking fluid, bleeding, severe headaches, or abdominal cramping.  If you are using any tobacco products, including cigarettes, chewing tobacco, and electronic cigarettes.  If you have any questions. Other tests that may be performed during your first trimester include:  Blood tests to find your blood type and to check for the presence of any previous infections. The tests will also be used to check for low iron levels (anemia) and protein on red blood cells (Rh antibodies). Depending on your risk factors, or if you previously had diabetes during pregnancy, you may have tests to check for high blood sugar that affects pregnant women (gestational diabetes).  Urine tests to check for infections, diabetes, or protein in the urine.  An ultrasound to confirm the proper growth and development of the baby.  Fetal screens for spinal cord problems (spina bifida) and Down syndrome.  HIV (human immunodeficiency virus) testing. Routine prenatal testing includes screening for HIV, unless you choose not to have this test.  You may need other tests to make sure you and the baby are doing well. Follow these instructions at home: Medicines  Follow your health care provider's instructions regarding medicine use. Specific medicines may be either safe or unsafe to take during pregnancy.  Take a prenatal vitamin that contains at least 600 micrograms (mcg) of folic acid.  If you develop constipation, try taking a stool  softener if your health care provider approves. Eating and drinking   Eat a balanced diet that includes fresh fruits and vegetables, whole grains, good sources of protein such as meat, eggs, or tofu, and low-fat  dairy. Your health care provider will help you determine the amount of weight gain that is right for you.  Avoid raw meat and uncooked cheese. These carry germs that can cause birth defects in the baby.  Eating four or five small meals rather than three large meals a day may help relieve nausea and vomiting. If you start to feel nauseous, eating a few soda crackers can be helpful. Drinking liquids between meals, instead of during meals, also seems to help ease nausea and vomiting.  Limit foods that are high in fat and processed sugars, such as fried and sweet foods.  To prevent constipation: ? Eat foods that are high in fiber, such as fresh fruits and vegetables, whole grains, and beans. ? Drink enough fluid to keep your urine clear or pale yellow. Activity  Exercise only as directed by your health care provider. Most women can continue their usual exercise routine during pregnancy. Try to exercise for 30 minutes at least 5 days a week. Exercising will help you: ? Control your weight. ? Stay in shape. ? Be prepared for labor and delivery.  Experiencing pain or cramping in the lower abdomen or lower back is a good sign that you should stop exercising. Check with your health care provider before continuing with normal exercises.  Try to avoid standing for long periods of time. Move your legs often if you must stand in one place for a long time.  Avoid heavy lifting.  Wear low-heeled shoes and practice good posture.  You may continue to have sex unless your health care provider tells you not to. Relieving pain and discomfort  Wear a good support bra to relieve breast tenderness.  Take warm sitz baths to soothe any pain or discomfort caused by hemorrhoids. Use hemorrhoid cream  if your health care provider approves.  Rest with your legs elevated if you have leg cramps or low back pain.  If you develop varicose veins in your legs, wear support hose. Elevate your feet for 15 minutes, 3-4 times a day. Limit salt in your diet. Prenatal care  Schedule your prenatal visits by the twelfth week of pregnancy. They are usually scheduled monthly at first, then more often in the last 2 months before delivery.  Write down your questions. Take them to your prenatal visits.  Keep all your prenatal visits as told by your health care provider. This is important. Safety  Wear your seat belt at all times when driving.  Make a list of emergency phone numbers, including numbers for family, friends, the hospital, and police and fire departments. General instructions  Ask your health care provider for a referral to a local prenatal education class. Begin classes no later than the beginning of month 6 of your pregnancy.  Ask for help if you have counseling or nutritional needs during pregnancy. Your health care provider can offer advice or refer you to specialists for help with various needs.  Do not use hot tubs, steam rooms, or saunas.  Do not douche or use tampons or scented sanitary pads.  Do not cross your legs for long periods of time.  Avoid cat litter boxes and soil used by cats. These carry germs that can cause birth defects in the baby and possibly loss of the fetus by miscarriage or stillbirth.  Avoid all smoking, herbs, alcohol, and medicines not prescribed by your health care provider. Chemicals in these products affect the formation and growth of the baby.  Do not use any products that  contain nicotine or tobacco, such as cigarettes and e-cigarettes. If you need help quitting, ask your health care provider. You may receive counseling support and other resources to help you quit.  Schedule a dentist appointment. At home, brush your teeth with a soft toothbrush and  be gentle when you floss. Contact a health care provider if:  You have dizziness.  You have mild pelvic cramps, pelvic pressure, or nagging pain in the abdominal area.  You have persistent nausea, vomiting, or diarrhea.  You have a bad smelling vaginal discharge.  You have pain when you urinate.  You notice increased swelling in your face, hands, legs, or ankles.  You are exposed to fifth disease or chickenpox.  You are exposed to [place] measles (rubella) and have never had it. Get help right away if:  You have a fever.  You are leaking fluid from your vagina.  You have spotting or bleeding from your vagina.  You have severe abdominal cramping or pain.  You have rapid weight gain or loss.  You vomit blood or material that looks like coffee grounds.  You develop a severe headache.  You have shortness of breath.  You have any kind of trauma, such as from a fall or a car accident. Summary  The first trimester of pregnancy is from week 1 until the end of week 13 (months 1 through 3).  Your body goes through many changes during pregnancy. The changes vary from woman to woman.  You will have routine prenatal visits. During those visits, your health care provider will examine you, discuss any test results you may have, and talk with you about how you are feeling. This information is not intended to replace advice given to you by your health care provider. Make sure you discuss any questions you have with your health care provider. Document Revised: 09/11/2017 Document Reviewed: 09/10/2016 Elsevier Patient Education  2020 Elsevier Inc.     Back Pain in Pregnancy Back pain during pregnancy is common. Back pain may be caused by several factors that are related to changes during your pregnancy. Follow these instructions at home: Managing pain, stiffness, and swelling      If directed, for sudden (acute) back pain, put ice on the painful area. ? Put ice in a plastic  bag. ? Place a towel between your skin and the bag. ? Leave the ice on for 20 minutes, 2-3 times per day.  If directed, apply heat to the affected area before you exercise. Use the heat source that your health care provider recommends, such as a moist heat pack or a heating pad. ? Place a towel between your skin and the heat source. ? Leave the heat on for 20-30 minutes. ? Remove the heat if your skin turns bright red. This is especially important if you are unable to feel pain, heat, or cold. You may have a greater risk of getting burned.  If directed, massage the affected area. Activity  Exercise as told by your health care provider. Gentle exercise is the best way to prevent or manage back pain.  Listen to your body when lifting. If lifting hurts, ask for help or bend your knees. This uses your leg muscles instead of your back muscles.  Squat down when picking up something from the floor. Do not bend over.  Only use bed rest for short periods as told by your health care provider. Bed rest should only be used for the most severe episodes of back pain. Standing,  sitting, and lying down  Do not stand in one place for long periods of time.  Use good posture when sitting. Make sure your head rests over your shoulders and is not hanging forward. Use a pillow on your lower back if necessary.  Try sleeping on your side, preferably the left side, with a pregnancy support pillow or 1-2 regular pillows between your legs. ? If you have back pain after a night's rest, your bed may be too soft. ? A firm mattress may provide more support for your back during pregnancy. General instructions  Do not wear high heels.  Eat a healthy diet. Try to gain weight within your health care provider's recommendations.  Use a maternity girdle, elastic sling, or back brace as told by your health care provider.  Take over-the-counter and prescription medicines only as told by your health care  provider.  Work with a physical therapist or massage therapist to find ways to manage back pain. Acupuncture or massage therapy may be helpful.  Keep all follow-up visits as told by your health care provider. This is important. Contact a health care provider if:  Your back pain interferes with your daily activities.  You have increasing pain in other parts of your body. Get help right away if:  You develop numbness, tingling, weakness, or problems with the use of your arms or legs.  You develop severe back pain that is not controlled with medicine.  You have a change in bowel or bladder control.  You develop shortness of breath, dizziness, or you faint.  You develop nausea, vomiting, or sweating.  You have back pain that is a rhythmic, cramping pain similar to labor pains. Labor pain is usually 1-2 minutes apart, lasts for about 1 minute, and involves a bearing down feeling or pressure in your pelvis.  You have back pain and your water breaks or you have vaginal bleeding.  You have back pain or numbness that travels down your leg.  Your back pain developed after you fell.  You develop pain on one side of your back.  You see blood in your urine.  You develop skin blisters in the area of your back pain. Summary  Back pain may be caused by several factors that are related to changes during your pregnancy.  Follow instructions as told by your health care provider for managing pain, stiffness, and swelling.  Exercise as told by your health care provider. Gentle exercise is the best way to prevent or manage back pain.  Take over-the-counter and prescription medicines only as told by your health care provider.  Keep all follow-up visits as told by your health care provider. This is important. This information is not intended to replace advice given to you by your health care provider. Make sure you discuss any questions you have with your health care provider. Document  Revised: 01/18/2019 Document Reviewed: 03/17/2018 Elsevier Patient Education  2020 [employer].

## 2020-01-05 NOTE — Discharge Instructions (Addendum)
You need to go to the [facility] emergency room

## 2020-01-05 NOTE — MAU Provider Note (Signed)
History     CSN: [id number]  Arrival date and time: 01/05/20 1349   First Provider Initiated Contact with Patient 01/05/20 [phone]      Chief Complaint  Patient presents with  . Abdominal Pain  . Back Pain   Ms. Kathryn Frost is a 29 y.o. G4P0010 at [redacted]w[redacted]d who presents to MAU for low back pain. Pt also reports she had lower abdominal pain last night, but denies any abdominal pain today.  Onset: yesterday Location: low back Duration: ~24hrs Character: sharp Aggravating/Associated: none/none Relieving: none Treatment: none Severity: 3/10  Pt denies VB, LOF, vaginal discharge/odor/itching. Pt denies N/V, abdominal pain, constipation, diarrhea, or urinary problems. Pt denies fever, chills, fatigue, sweating or changes in appetite. Pt denies SOB or chest pain. Pt denies dizziness, HA, light-headedness, weakness.  Problems this pregnancy include: pt has not yet been seen. Allergies? NKDA Current medications/supplements? none Prenatal care provider? List of OB providers requested   OB History    Gravida  4   Para      Term      Preterm      AB  1   Living        SAB      TAB  1   Ectopic      Multiple      Live Births  2           Past Medical History:  Diagnosis Date  . Anemia   . Meningitis     History reviewed. No pertinent surgical history.  Family History  Problem Relation Age of Onset  . Healthy Mother   . Healthy Father     Social History   Tobacco Use  . Smoking status: Never Smoker  . Smokeless tobacco: Never Used  Substance Use Topics  . Alcohol use: Yes  . Drug use: Not on file    Allergies: No Known Allergies  No medications prior to admission.    Review of Systems  Constitutional: Negative for chills, diaphoresis, fatigue and fever.  Eyes: Negative for visual disturbance.  Respiratory: Negative for shortness of breath.   Cardiovascular: Negative for chest pain.  Gastrointestinal: Negative for abdominal pain,  constipation, diarrhea, nausea and vomiting.  Genitourinary: Negative for dysuria, flank pain, frequency, pelvic pain, urgency, vaginal bleeding and vaginal discharge.  Musculoskeletal: Positive for back pain.  Neurological: Negative for dizziness, weakness, light-headedness and headaches.   Physical Exam   Blood pressure (!) 104/59, pulse 70, temperature 98.4 F (36.9 C), resp. rate 16, height 5\' 7"  (1.702 m), weight 63.5 kg, last menstrual period 11/29/2019, SpO2 100 %, unknown if currently breastfeeding.  Patient Vitals for the past 24 hrs:  BP Temp Pulse Resp SpO2 Height Weight  01/05/20 1431 (!) 104/59 98.4 F (36.9 C) 70 16 100 % 5\' 7"  (1.702 m) 63.5 kg   Physical Exam  Constitutional: She is oriented to person, place, and time. She appears well-developed and well-nourished. No distress.  HENT:  Head: Normocephalic and atraumatic.  Respiratory: Effort normal.  GI: Soft. She exhibits no distension and no mass. There is no abdominal tenderness. There is no rebound and no guarding.  Musculoskeletal:     Comments: Generalized tenderness on palpation along low back  Neurological: She is alert and oriented to person, place, and time.  Skin: Skin is warm and dry. She is not diaphoretic.  Psychiatric: She has a normal mood and affect. Her behavior is normal. Judgment and thought content normal.   Results for orders placed  or performed during the hospital encounter of 01/05/20 (from the past 24 hour(s))  CBC     Status: None   Collection Time: 01/05/20  3:20 PM  Result Value Ref Range   WBC 7.7 4.0 - 10.5 K/uL   RBC 4.22 3.87 - 5.11 MIL/uL   Hemoglobin 12.2 12.0 - 15.0 g/dL   HCT 43.3 29.5 - 18.8 %   MCV 86.5 80.0 - 100.0 fL   MCH 28.9 26.0 - 34.0 pg   MCHC 33.4 30.0 - 36.0 g/dL   RDW 41.6 60.6 - 30.1 %   Platelets 294 150 - 400 K/uL   nRBC 0.0 0.0 - 0.2 %  Comprehensive metabolic panel     Status: None   Collection Time: 01/05/20  3:20 PM  Result Value Ref Range   Sodium 139  135 - 145 mmol/L   Potassium 3.8 3.5 - 5.1 mmol/L   Chloride 108 98 - 111 mmol/L   CO2 23 22 - 32 mmol/L   Glucose, Bld 74 70 - 99 mg/dL   BUN 9 6 - 20 mg/dL   Creatinine, Ser 6.01 0.44 - 1.00 mg/dL   Calcium 9.0 8.9 - 09.3 mg/dL   Total Protein 6.9 6.5 - 8.1 g/dL   Albumin 3.9 3.5 - 5.0 g/dL   AST 21 15 - 41 U/L   ALT 17 0 - 44 U/L   Alkaline Phosphatase 61 38 - 126 U/L   Total Bilirubin 1.1 0.3 - 1.2 mg/dL   GFR calc non Af Amer >60 >60 mL/min   GFR calc Af Amer >60 >60 mL/min   Anion gap 8 5 - 15  hCG, quantitative, pregnancy     Status: Abnormal   Collection Time: 01/05/20  3:20 PM  Result Value Ref Range   hCG, Beta Chain, Quant, S 5,260 (H) <5 mIU/mL  Wet prep, genital     Status: Abnormal   Collection Time: 01/05/20  3:39 PM  Result Value Ref Range   Yeast Wet Prep HPF POC NONE SEEN NONE SEEN   Trich, Wet Prep NONE SEEN NONE SEEN   Clue Cells Wet Prep HPF POC NONE SEEN NONE SEEN   WBC, Wet Prep HPF POC MODERATE (A) NONE SEEN   Sperm NONE SEEN   Urinalysis, Routine w reflex microscopic     Status: Abnormal   Collection Time: 01/05/20  4:33 PM  Result Value Ref Range   Color, Urine YELLOW YELLOW   APPearance HAZY (A) CLEAR   Specific Gravity, Urine 1.017 1.005 - 1.030   pH 6.0 5.0 - 8.0   Glucose, UA NEGATIVE NEGATIVE mg/dL   Hgb urine dipstick NEGATIVE NEGATIVE   Bilirubin Urine NEGATIVE NEGATIVE   Ketones, ur NEGATIVE NEGATIVE mg/dL   Protein, ur NEGATIVE NEGATIVE mg/dL   Nitrite NEGATIVE NEGATIVE   Leukocytes,Ua TRACE (A) NEGATIVE   RBC / HPF 0-5 0 - 5 RBC/hpf   WBC, UA 6-10 0 - 5 WBC/hpf   Bacteria, UA RARE (A) NONE SEEN   Squamous Epithelial / LPF 6-10 0 - 5   US OB LESS THAN 14 WEEKS WITH OB TRANSVAGINAL  Result Date: 01/05/2020 CLINICAL DATA:  Pelvic pain. EXAM: OBSTETRIC <14 WK [place] AND TRANSVAGINAL OB US TECHNIQUE: Both transabdominal and transvaginal ultrasound examinations were performed for complete evaluation of the gestation as well as the maternal  uterus, adnexal regions, and pelvic cul-de-sac. Transvaginal technique was performed to assess early pregnancy. COMPARISON:  None. FINDINGS: Intrauterine gestational sac: Single Yolk sac:  Visualized. Embryo:  Not Visualized. Cardiac Activity: Not Visualized. Heart Rate: N/A  bpm MSD: 7.2 mm   5 w   3 d Subchorionic hemorrhage:  A small subchorionic hemorrhage is noted. Maternal uterus/adnexae: The bilateral ovaries are normal in appearance. There is a trace amount of pelvic free fluid. IMPRESSION: 1. Single intrauterine pregnancy at approximately 5 weeks and 3 days gestation by ultrasound evaluation. 2. Small subchorionic hemorrhage. Electronically Signed   By: Aram Candela M.D.   On: 01/05/2020 17:11    MAU Course  Procedures  MDM -r/o ectopic -UA: hazy/trace leuks/rare bacteria, sending urine for culture -CBC: WNL -CMP: WNL -[place]: single IUP + yolk sac, no embroy, small SCH -hCG: 5,260 -ABO: O Positive -WetPrep: WNL -GC/CT collected -Tylenol 1000mg  given for back pain, pt reports back pain now 0/10 -pt discharged to home in stable condition  Orders Placed This Encounter  Procedures  . Wet prep, genital    Standing Status:   Standing    Number of Occurrences:   1  . Culture, OB Urine    Standing Status:   Standing    Number of Occurrences:   1  . OB LESS THAN 14 WEEKS WITH OB TRANSVAGINAL    Standing Status:   Standing    Number of Occurrences:   1    Order Specific Question:   Symptom/Reason for Exam    Answer:   Low back pain during pregnancy in first trimester [place]  . [[id number]] OB LESS THAN 14 WEEKS WITH OB TRANSVAGINAL    Please schedule patient for follow-up US, Monday - Thursday between 8:00 am - 10:00 am and 12:00 pm - 3:00 pm. The patient will follow-up with [facility] immediately after 01-26-1981 for results.    Standing Status:   Future    Standing Expiration Date:   03/06/2021    Order Specific Question:   Reason for Exam (SYMPTOM  OR DIAGNOSIS REQUIRED)    Answer:    viability    Order Specific Question:   Preferred Imaging Location?    Answer:   [facility]  . Urinalysis, Routine w reflex microscopic    Standing Status:   Standing    Number of Occurrences:   1  . CBC    Standing Status:   Standing    Number of Occurrences:   1  . Comprehensive metabolic panel    Standing Status:   Standing    Number of Occurrences:   1  . hCG, quantitative, pregnancy    Standing Status:   Standing    Number of Occurrences:   1  . Discharge patient    Order Specific Question:   Discharge disposition    Answer:   01-Home or Self Care [1]    Order Specific Question:   Discharge patient date    Answer:   01/05/2020   Meds ordered this encounter  Medications  . acetaminophen (TYLENOL) tablet 1,000 mg    Assessment and Plan   1. Intrauterine pregnancy   2. Low back pain during pregnancy in first trimester   3. [redacted] weeks gestation of pregnancy   4. Blood type, Rh positive   5. Subchorionic hemorrhage of placenta in first trimester, single or unspecified fetus    Allergies as of 01/05/2020   No Known Allergies     Medication List    You have not been prescribed any medications.    -will call with culture results, if positive -order entered for 01/07/2020 in 10-14 days to assess pregnancy  viability -pt to start PNVs -safe meds in pregnancy list given -OB providers list given -work note given per pt request -pain/bleeding/return MAU precautions given -pt discharged to home in stable condition  Elmyra Ricks E Daisee Centner 01/05/2020, 6:06 PM

## 2020-01-05 NOTE — MAU Note (Signed)
.   Kathryn Frost is a 29 y.o. at [redacted]w[redacted]d here in MAU reporting: lower back that started yesterday. Pt has had lower abdominal cramping but states no pain now in her abdomen. Denies any VB  LMP:11/29/19  Onset of complaint:yesterday       Pain score: 3 Vitals:   01/05/20 1431  BP: (!) 104/59  Pulse: 70  Resp: 16  Temp: 98.4 F (36.9 C)  SpO2: 100%     FHT: Lab orders placed from triage: UA

## 2020-01-05 NOTE — ED Provider Notes (Signed)
BP 109/77 (BP Location: Left Arm)   Pulse 79   Temp 98.4 F (36.9 C) (Oral)   Resp 16   LMP 11/29/2019   SpO2 100%  Patient presented to the urgent care center for evaluation of lower abdominal crampy pain, low back pain, and pregnancy test.  The pregnancy test was identified as positive.  Because of the possibility of pregnancy complications the patient was transferred to the maternal unit.   Eustace Moore, MD 01/05/20 2105

## 2020-01-05 NOTE — ED Triage Notes (Signed)
Pt states she took home pregnancy test: first test approx 03/08 (negative); 2nd test on 03/16 later and it was "positive".  C/o lower abdom pain/pelvic discomfort along with lower back pain for approx 2 days. Denies dysuria sx, fever, chills, n/v/d.

## 2020-01-06 LAB — GC/CHLAMYDIA PROBE AMP (~~LOC~~) NOT AT ARMC
Chlamydia: NEGATIVE
Comment: NEGATIVE
Comment: NORMAL
Neisseria Gonorrhea: NEGATIVE

## 2020-01-06 LAB — CULTURE, OB URINE: Culture: 80000 — AB

## 2020-01-17 ENCOUNTER — Encounter: Payer: Self-pay | Admitting: Family Medicine

## 2020-01-17 ENCOUNTER — Ambulatory Visit (HOSPITAL_COMMUNITY)
Admission: RE | Admit: 2020-01-17 | Discharge: 2020-01-17 | Disposition: A | Discharge status: Home / Self Care | Payer: Self-pay | Source: Ambulatory Visit | Attending: Women's Health | Admitting: Women's Health

## 2020-01-17 ENCOUNTER — Ambulatory Visit (INDEPENDENT_AMBULATORY_CARE_PROVIDER_SITE_OTHER): Payer: Self-pay

## 2020-01-17 ENCOUNTER — Other Ambulatory Visit: Payer: Self-pay

## 2020-01-17 DIAGNOSIS — Z3A01 Less than 8 weeks gestation of pregnancy: Secondary | ICD-10-CM | POA: Insufficient documentation

## 2020-01-17 DIAGNOSIS — O3680X0 Pregnancy with inconclusive fetal viability, not applicable or unspecified: Secondary | ICD-10-CM

## 2020-01-17 DIAGNOSIS — Z3490 Encounter for supervision of normal pregnancy, unspecified, unspecified trimester: Secondary | ICD-10-CM | POA: Insufficient documentation

## 2020-01-17 NOTE — Progress Notes (Signed)
Pt here today for OB US results.  Results show viable pregnancy with EDD 09/07/20 and pt 6w 4d today.  Notified Gerrit Heck, CNM  who recommends pt to start prenatal care and begin taking PNV.   [place] pics given.  Pt requests a letter to give to her employment because on her AVS provided by MAU it stated that she should not lift more than 10 lbs and because her job description requires that she lifts more than 10 lbs she has not been to work in a week and her job requires a note.  Consulted with Gerrit Heck, CNM and provider states that we did not recommend that pt have lift restrictions so we can not produce letter.  I explained to the pt that because a medical provider did not recommend weight restrictions and that she has not established care with [place] that we could not produce the letter for her.  I apologized to the pt and advised her to possibly communicate with her employment and let them know that she misunderstood what was in the paper work (AVS) that was provided to her in MAU.  Pt stated that it was okay and that she would contact her employment.  Medications/allerigies reviewed.  Front office to provide proof of pregnancy letter to start prenatal care.   Addison Naegeli, RN 01/17/20

## 2020-01-17 NOTE — Progress Notes (Signed)
Patient seen and assessed by nursing staff during this encounter. I have reviewed the chart and actively participated in the POC.  I agree with the documentation and plan. I have also made any necessary editorial changes.  Cherre Robins, CNM 01/17/2020 12:20 PM

## 2020-07-17 ENCOUNTER — Emergency Department
Admission: EM | Admit: 2020-07-17 | Discharge: 2020-07-17 | Disposition: A | Discharge status: Home / Self Care | Payer: Medicaid Other | Attending: Emergency Medicine | Admitting: Emergency Medicine

## 2020-07-17 ENCOUNTER — Other Ambulatory Visit: Payer: Self-pay

## 2020-07-17 ENCOUNTER — Encounter: Payer: Self-pay | Admitting: Emergency Medicine

## 2020-07-17 DIAGNOSIS — N939 Abnormal uterine and vaginal bleeding, unspecified: Secondary | ICD-10-CM | POA: Insufficient documentation

## 2020-07-17 LAB — BASIC METABOLIC PANEL
Anion gap: 8 (ref 5–15)
BUN: 13 mg/dL (ref 6–20)
CO2: 25 mmol/L (ref 22–32)
Calcium: 8.7 mg/dL — ABNORMAL LOW (ref 8.9–10.3)
Chloride: 105 mmol/L (ref 98–111)
Creatinine, Ser: 0.87 mg/dL (ref 0.44–1.00)
GFR calc non Af Amer: >60 mL/min (ref >60)
Glucose, Bld: 92 mg/dL (ref 70–99)
Potassium: 3.6 mmol/L (ref 3.5–5.1)
Sodium: 138 mmol/L (ref 135–145)

## 2020-07-17 LAB — URINALYSIS, ROUTINE W REFLEX MICROSCOPIC
Bilirubin Urine: NEGATIVE
Glucose, UA: NEGATIVE mg/dL
Ketones, ur: NEGATIVE mg/dL
Leukocytes,Ua: NEGATIVE
Nitrite: NEGATIVE
Protein, ur: 30 mg/dL — AB
RBC / HPF: >50 RBC/hpf — ABNORMAL HIGH (ref 0–5)
Specific Gravity, Urine: 1.018 (ref 1.005–1.030)
pH: 6 (ref 5.0–8.0)

## 2020-07-17 LAB — POCT PREGNANCY, URINE: Preg Test, Ur: NEGATIVE

## 2020-07-17 LAB — HCG, QUANTITATIVE, PREGNANCY: hCG, Beta Chain, Quant, S: 1 m[IU]/mL (ref <5)

## 2020-07-17 LAB — CBC
HCT: 35 % — ABNORMAL LOW (ref 36.0–46.0)
Hemoglobin: 11.7 g/dL — ABNORMAL LOW (ref 12.0–15.0)
MCH: 28.8 pg (ref 26.0–34.0)
MCHC: 33.4 g/dL (ref 30.0–36.0)
MCV: 86.2 fL (ref 80.0–100.0)
Platelets: 317 10*3/uL (ref 150–400)
RBC: 4.06 MIL/uL (ref 3.87–5.11)
RDW: 13 % (ref 11.5–15.5)
WBC: 7.7 10*3/uL (ref 4.0–10.5)
nRBC: 0 % (ref 0.0–0.2)

## 2020-07-17 NOTE — ED Triage Notes (Addendum)
Pt arrived via POV with reports of recently finding out she is pregnant, reports vaginal bleeding that started today, Pt reports when she urinated she noted strings of red blood and also reports that she had to change her underwear after work because she had been bleeding.  Pt states she had positive preg test on Saturday.  Pt denies any pain at this time.  G-3 P-2. Pt does not have OB-GYN usually goes to [facility] for medical care.

## 2020-07-17 NOTE — ED Provider Notes (Signed)
[facility] Emergency Department Provider Note ____________________________________________   First MD Initiated Contact with Patient 07/17/20 1847     (approximate)  I have reviewed the triage vital signs and the nursing notes.  HISTORY  Chief Complaint Vaginal Bleeding   HPI Kathryn BENTSEN is a 29 y.o. femalewho presents to the ED for evaluation of vaginal bleeding.  Chart review indicates no relevant medical history. Patient reports being a G3 P2 A1.  Reports elective termination of her pregnancy about 3 months ago, otherwise 2 healthy children at home.  Patient reports developing the desire for doughnuts 2 weeks ago, and thought that she was pregnant because of this.  She reports taking "a ton" of urine pregnancy tests, and reports that "some of them were positive."  She shows me a photo on her iPhone of 4 OTC pregnancy tests lined up with in a photo, 2 of which are obviously negative, and 2 show an extremely faint line to indicate a positive test.  She denies following up with OB/GYN or ultrasound confirmation.  Patient reports her LMP was on 06/20/2020.  Patient reports vaginal spotting that started today, and so presents to the ED for evaluation.  She denies any abdominal pain, nausea, vomiting, diarrhea or other vaginal discharge.  She reports feeling fine right now.  She denies using any tampons or pads, and indicates that this was low-volume spotting only.    Past Medical History:  Diagnosis Date  . Anemia   . Meningitis     There are no problems to display for this patient.   History reviewed. No pertinent surgical history.  Prior to Admission medications   Medication Sig Start Date End Date Taking? Authorizing Provider  famotidine (PEPCID) 20 MG tablet Take 1 tablet (20 mg total) by mouth 2 (two) times daily. 05/06/19 01/05/20  Sharman Cheek, MD    Allergies Patient has no known allergies.  Family History  Problem Relation Age of  Onset  . Healthy Mother   . Healthy Father     Social History Social History   Tobacco Use  . Smoking status: Never Smoker  . Smokeless tobacco: Never Used  Vaping Use  . Vaping Use: Never used  Substance Use Topics  . Alcohol use: Yes  . Drug use: Not on file    Review of Systems  Constitutional: No fever/chills Eyes: No visual changes. ENT: No sore throat. Cardiovascular: Denies chest pain. Respiratory: Denies shortness of breath. Gastrointestinal: No abdominal pain.  No nausea, no vomiting.  No diarrhea.  No constipation.   Genitourinary: Negative for dysuria.  Vaginal bleeding/spotting Musculoskeletal: Negative for back pain. Skin: Negative for rash. Neurological: Negative for headaches, focal weakness or numbness.  ____________________________________________   PHYSICAL EXAM:  VITAL SIGNS: Vitals:   07/17/20 1729  BP: 116/70  Pulse: 66  Resp: 18  Temp: 98.6 F (37 C)  SpO2: 100%      Constitutional: Alert and oriented. Well appearing and in no acute distress. Eyes: Conjunctivae are normal. PERRL. EOMI. Head: Atraumatic. Nose: No congestion/rhinnorhea. Mouth/Throat: Mucous membranes are moist.  Oropharynx non-erythematous. Neck: No stridor. No cervical spine tenderness to palpation. Cardiovascular: Normal rate, regular rhythm. Grossly normal heart sounds.  Good peripheral circulation. Respiratory: Normal respiratory effort.  No retractions. Lungs CTAB. Gastrointestinal: Soft , nondistended, nontender to palpation. No abdominal bruits. No CVA tenderness. Musculoskeletal: No lower extremity tenderness nor edema.  No joint effusions. No signs of acute trauma. Neurologic:  Normal speech and language. No gross focal  neurologic deficits are appreciated. No gait instability noted. Skin:  Skin is warm, dry and intact. No rash noted. Psychiatric: Mood and affect are normal. Speech and behavior are normal.  ____________________________________________    LABS (all labs ordered are listed, but only abnormal results are displayed)  Labs Reviewed  CBC - Abnormal; Notable for the following components:      Result Value   Hemoglobin 11.7 (*)    HCT 35.0 (*)    All other components within normal limits  BASIC METABOLIC PANEL - Abnormal; Notable for the following components:   Calcium 8.7 (*)    All other components within normal limits  URINALYSIS, ROUTINE W REFLEX MICROSCOPIC - Abnormal; Notable for the following components:   Color, Urine YELLOW (*)    APPearance HAZY (*)    Hgb urine dipstick LARGE (*)    Protein, ur 30 (*)    RBC / HPF >50 (*)    Bacteria, UA RARE (*)    All other components within normal limits  HCG, QUANTITATIVE, PREGNANCY  POCT PREGNANCY, URINE  POC URINE PREG, ED   ____________________________________________   MDM / ED COURSE  29 year old female presents to the ED with vaginal bleeding most consistent with her menstrual period, without evidence of acute pathology or pregnancy, and amenable to outpatient management.  Normal vital signs on room air.  Exam without evidence of acute pathology.  She has a benign abdomen and looks well without distress.  No signs of trauma or neurovascular deficits.  Blood work shows stable hemoglobin, negative UPT and negative serum beta quant.  I suspect patient was never actually pregnant and that she is just on her regular menstrual period.  With her benign presentation and exam, I see no indication for further urgent work-up in the ED. we discussed following up with PCP and OB/GYN.  We discussed return precautions for the ED.  Patient medically stable for discharge home.      ____________________________________________   FINAL CLINICAL IMPRESSION(S) / ED DIAGNOSES  Final diagnoses:  Vaginal bleeding     ED Discharge Orders    None       Monicia Tse   Note:  This document was prepared using Dragon voice recognition software and may include unintentional  dictation errors.   Delton Prairie, MD 07/17/20 [phone]

## 2020-07-17 NOTE — Discharge Instructions (Signed)
Please take Tylenol and ibuprofen/Advil for your pain.  It is safe to take them together, or to alternate them every few hours.  Take up to 1000mg  of Tylenol at a time, up to 4 times per day.  Do not take more than 4000 mg of Tylenol in 24 hours.  For ibuprofen, take 400-600 mg, 4-5 times per day.  Return to the ED with any worsening symptoms, fevers with your symptoms or worsening abdominal pain  You are not pregnant.

## 2020-10-10 ENCOUNTER — Emergency Department
Admission: EM | Admit: 2020-10-10 | Discharge: 2020-10-10 | Disposition: A | Discharge status: Home / Self Care | Payer: BC Managed Care – PPO | Attending: Emergency Medicine | Admitting: Emergency Medicine

## 2020-10-10 ENCOUNTER — Emergency Department: Payer: BC Managed Care – PPO

## 2020-10-10 ENCOUNTER — Other Ambulatory Visit: Payer: Self-pay

## 2020-10-10 DIAGNOSIS — R0789 Other chest pain: Secondary | ICD-10-CM

## 2020-10-10 LAB — CBC
HCT: 37.3 % (ref 36.0–46.0)
Hemoglobin: 12.3 g/dL (ref 12.0–15.0)
MCH: 28 pg (ref 26.0–34.0)
MCHC: 33 g/dL (ref 30.0–36.0)
MCV: 85 fL (ref 80.0–100.0)
Platelets: 300 10*3/uL (ref 150–400)
RBC: 4.39 MIL/uL (ref 3.87–5.11)
RDW: 13.7 % (ref 11.5–15.5)
WBC: 6.1 10*3/uL (ref 4.0–10.5)
nRBC: 0 % (ref 0.0–0.2)

## 2020-10-10 LAB — BASIC METABOLIC PANEL
Anion gap: 8 (ref 5–15)
BUN: 10 mg/dL (ref 6–20)
CO2: 25 mmol/L (ref 22–32)
Calcium: 9 mg/dL (ref 8.9–10.3)
Chloride: 104 mmol/L (ref 98–111)
Creatinine, Ser: 0.74 mg/dL (ref 0.44–1.00)
GFR, Estimated: >60 mL/min (ref >60)
Glucose, Bld: 84 mg/dL (ref 70–99)
Potassium: 3.5 mmol/L (ref 3.5–5.1)
Sodium: 137 mmol/L (ref 135–145)

## 2020-10-10 LAB — TROPONIN I (HIGH SENSITIVITY): Troponin I (High Sensitivity): <2 ng/L (ref <18)

## 2020-10-10 NOTE — ED Notes (Signed)
AAOx3.  Skin warm and dry.  NAD 

## 2020-10-10 NOTE — ED Provider Notes (Signed)
[facility] Emergency Department Provider Note   ____________________________________________    I have reviewed the triage vital signs and the nursing notes.   HISTORY  Chief Complaint Chest Pain     HPI Kathryn Frost is a 29 y.o. female presents with complaints of left-sided chest discomfort over the last 3 weeks.  Patient reports that she has it every day, does not seem to be related to exertion, does not recognize any pattern to it.  No pleurisy.  No fevers chills or cough, some fatigue.  Has never had this before.  No sick contacts.  Has not take anything for this.  Does smoke cigarettes.  Past Medical History:  Diagnosis Date  . Anemia   . Meningitis     There are no problems to display for this patient.   History reviewed. No pertinent surgical history.  Prior to Admission medications   Medication Sig Start Date End Date Taking? Authorizing Provider  famotidine (PEPCID) 20 MG tablet Take 1 tablet (20 mg total) by mouth 2 (two) times daily. 05/06/19 01/05/20  Sharman Cheek, MD     Allergies Patient has no known allergies.  Family History  Problem Relation Age of Onset  . Healthy Mother   . Healthy Father     Social History Social History   Tobacco Use  . Smoking status: Never Smoker  . Smokeless tobacco: Never Used  Vaping Use  . Vaping Use: Never used  Substance Use Topics  . Alcohol use: Yes  . Drug use: Not Currently    Review of Systems  Constitutional: No fever/chills Eyes: No visual changes.  ENT: No sore throat. Cardiovascular: As above Respiratory: Denies shortness of breath. Gastrointestinal: No abdominal pain.  No nausea, no vomiting.   Genitourinary: Negative for dysuria. Musculoskeletal: Negative for back pain. Skin: Negative for rash. Neurological: Negative for headaches or weakness   ____________________________________________   PHYSICAL EXAM:  VITAL SIGNS: ED Triage Vitals  Enc Vitals  Group     BP 10/10/20 0816 110/82     Pulse Rate 10/10/20 0816 71     Resp 10/10/20 0816 16     Temp 10/10/20 0816 99.1 F (37.3 C)     Temp Source 10/10/20 0816 Oral     SpO2 10/10/20 0816 100 %     Weight 10/10/20 0814 63.5 kg (140 lb)     Height 10/10/20 0814 1.702 m (5\' 7" )     Head Circumference --      Peak Flow --      Pain Score 10/10/20 0814 4     Pain Loc --      Pain Edu? --      Excl. in GC? --     Constitutional: Alert and oriented.   Nose: No congestion/rhinnorhea. Mouth/Throat: Mucous membranes are moist.    Cardiovascular: Normal rate, regular rhythm. Grossly normal heart sounds.  Good peripheral circulation.  No tenderness to palpation Respiratory: Normal respiratory effort.  No retractions. Lungs CTAB. Gastrointestinal: Soft and nontender. No distention.    Musculoskeletal: No lower extremity tenderness nor edema.  Warm and well perfused Neurologic:  Normal speech and language. No gross focal neurologic deficits are appreciated.  Skin:  Skin is warm, dry and intact. No rash noted. Psychiatric: Mood and affect are normal. Speech and behavior are normal.  ____________________________________________   LABS (all labs ordered are listed, but only abnormal results are displayed)  Labs Reviewed  BASIC METABOLIC PANEL  CBC  POC URINE  PREG, ED  TROPONIN I (HIGH SENSITIVITY)  TROPONIN I (HIGH SENSITIVITY)   ____________________________________________  EKG  ED ECG REPORT I, Jene Every, the attending physician, personally viewed and interpreted this ECG.  Date: 10/10/2020  Rhythm: normal sinus rhythm QRS Axis: normal Intervals: normal ST/T Wave abnormalities: normal Narrative Interpretation: no evidence of acute ischemia  ____________________________________________  RADIOLOGY  Chest x-ray reviewed by me, no acute abnormality ____________________________________________   PROCEDURES  Procedure(s) performed: No  Procedures   Critical  Care performed: No ____________________________________________   INITIAL IMPRESSION / ASSESSMENT AND PLAN / ED COURSE  Pertinent labs & imaging results that were available during my care of the patient were reviewed by me and considered in my medical decision making (see chart for details).  Patient with 3 weeks of intermittent chest discomfort, EKG is reassuring.  I suspect troponin is undetectable.  Chest x-ray without evidence of pneumonia or abnormality. CBC and BMP are normal.  Vital signs and exam are reassuring, appropriate for discharge at this time with cardiology follow-up.  Turn precautions discussed    ____________________________________________   FINAL CLINICAL IMPRESSION(S) / ED DIAGNOSES  Final diagnoses:  Atypical chest pain        Note:  This document was prepared using Dragon voice recognition software and may include unintentional dictation errors.   Jene Every, MD 10/10/20 1025

## 2020-10-10 NOTE — ED Triage Notes (Signed)
Pt c/o intermittent left sided chest pain "in my heart" sharp pain over the past 2 weeks with fatigue. Pt is in NAD at present., skin is warm and dry, respirations WNL

## 2021-04-06 ENCOUNTER — Other Ambulatory Visit: Payer: Self-pay

## 2021-04-06 ENCOUNTER — Emergency Department
Admission: EM | Admit: 2021-04-06 | Discharge: 2021-04-06 | Disposition: A | Discharge status: Home / Self Care | Payer: Medicaid Other | Attending: Emergency Medicine | Admitting: Emergency Medicine

## 2021-04-06 ENCOUNTER — Encounter: Payer: Self-pay | Admitting: Emergency Medicine

## 2021-04-06 DIAGNOSIS — L299 Pruritus, unspecified: Secondary | ICD-10-CM | POA: Diagnosis not present

## 2021-04-06 DIAGNOSIS — R21 Rash and other nonspecific skin eruption: Secondary | ICD-10-CM | POA: Insufficient documentation

## 2021-04-06 MED ORDER — HYDROXYZINE HCL 50 MG PO TABS: 50.0000 mg | ORAL_TABLET | Freq: Three times a day (TID) | ORAL | 0 refills | Status: DC | PRN

## 2021-04-06 MED ORDER — METHYLPREDNISOLONE 4 MG PO TBPK: ORAL_TABLET | ORAL | 0 refills | Status: DC

## 2021-04-06 MED ORDER — METHYLPREDNISOLONE SODIUM SUCC 125 MG IJ SOLR
125.0000 mg | Freq: Once | INTRAMUSCULAR | Status: AC
  Administered 2021-04-06: 125 mg via INTRAMUSCULAR
  Filled 2021-04-06: qty 2

## 2021-04-06 MED ORDER — HYDROXYZINE HCL 50 MG PO TABS
50.0000 mg | ORAL_TABLET | Freq: Once | ORAL | Status: AC
  Administered 2021-04-06: 50 mg via ORAL
  Filled 2021-04-06: qty 1

## 2021-04-06 NOTE — ED Notes (Signed)
Pt verbalizes understanding of d/c instructions, medications and follow up 

## 2021-04-06 NOTE — ED Provider Notes (Signed)
[facility] Emergency Department Provider Note   ____________________________________________   Event Date/Time   First MD Initiated Contact with Patient 04/06/21 [phone]     (approximate)  I have reviewed the triage vital signs and the nursing notes.   HISTORY  Chief Complaint Rash    HPI Kathryn Frost is a 30 y.o. female patient presents for intermittent rash for over a month.  Patient stated rash on her back, arms, abdomen and lower extremities.  Patient stated neck and facial area spared.  Patient stated rash associated with itching.  Patient unsure of new laundry products but stated no new personal hygiene products.  Patient is concerned she might have "shingles".         Past Medical History:  Diagnosis Date   Anemia    Meningitis     There are no problems to display for this patient.   History reviewed. No pertinent surgical history.  Prior to Admission medications   Medication Sig Start Date End Date Taking? Authorizing Provider  hydrOXYzine (ATARAX/VISTARIL) 50 MG tablet Take 1 tablet (50 mg total) by mouth 3 (three) times daily as needed. 04/06/21  Yes Joni Reining, PA-C  methylPREDNISolone (MEDROL DOSEPAK) 4 MG TBPK tablet Take Tapered dose as directed 04/06/21  Yes Joni Reining, PA-C  famotidine (PEPCID) 20 MG tablet Take 1 tablet (20 mg total) by mouth 2 (two) times daily. 05/06/19 01/05/20  Sharman Cheek, MD    Allergies Pitocin [oxytocin]  Family History  Problem Relation Age of Onset   Healthy Mother    Healthy Father     Social History Social History   Tobacco Use   Smoking status: Never   Smokeless tobacco: Never  Vaping Use   Vaping Use: Never used  Substance Use Topics   Alcohol use: Yes   Drug use: Not Currently    Review of Systems  Constitutional: No fever/chills Eyes: No visual changes. ENT: No sore throat. Cardiovascular: Denies chest pain. Respiratory: Denies shortness of  breath. Gastrointestinal: No abdominal pain.  No nausea, no vomiting.  No diarrhea.  No constipation. Genitourinary: Negative for dysuria. Musculoskeletal: Negative for back pain. Skin: Positive for rash. Neurological: Negative for headaches, focal weakness or numbness. Hematological/Lymphatic: Anemia Allergic/Immunilogical: Pitocin  ____________________________________________   PHYSICAL EXAM:  VITAL SIGNS: ED Triage Vitals  Enc Vitals Group     BP 04/06/21 0912 102/67     Pulse Rate 04/06/21 0912 81     Resp 04/06/21 0912 20     Temp 04/06/21 0912 98.7 F (37.1 C)     Temp Source 04/06/21 0912 Oral     SpO2 04/06/21 0912 98 %     Weight 04/06/21 0905 138 lb (62.6 kg)     Height 04/06/21 0905 5\' 7"  (1.702 m)     Head Circumference --      Peak Flow --      Pain Score 04/06/21 0905 0     Pain Loc --      Pain Edu? --      Excl. in GC? --     Constitutional: Alert and oriented. Well appearing and in no acute distress. Eyes: Conjunctivae are normal. PERRL. EOMI. Head: Atraumatic. Nose: No congestion/rhinnorhea. Mouth/Throat: Mucous membranes are moist.  Oropharynx non-erythematous. Neck: No stridor.   Hematological/Lymphatic/Immunilogical: No cervical lymphadenopathy. Cardiovascular: Normal rate, regular rhythm. Grossly normal heart sounds.  Good peripheral circulation. Respiratory: Normal respiratory effort.  No retractions. Lungs CTAB. Genitourinary: Deferred Musculoskeletal: No lower extremity tenderness  nor edema.  No joint effusions. Neurologic:  Normal speech and language. No gross focal neurologic deficits are appreciated. No gait instability. Skin:  Skin is warm, dry and intact.  Erythematous macular lesions sparing the neck and facial area.   Psychiatric: Mood and affect are normal. Speech and behavior are normal.  ____________________________________________   LABS (all labs ordered are listed, but only abnormal results are displayed)  Labs Reviewed - No  data to display ____________________________________________  EKG   ____________________________________________  RADIOLOGY I, Joni Reining, personally viewed and evaluated these images (plain radiographs) as part of my medical decision making, as well as reviewing the written report by the radiologist.  ED MD interpretation:    Official radiology report(s): No results found.  ____________________________________________   PROCEDURES  Procedure(s) performed (including Critical Care):  Procedures   ____________________________________________   INITIAL IMPRESSION / ASSESSMENT AND PLAN / ED COURSE  As part of my medical decision making, I reviewed the following data within the electronic MEDICAL RECORD NUMBER         Patient presents with intermitting rash that is diffuse sparing the facial area.  No vesicle lesions.  No signs symptoms secondary infection.  Patient complaining physical exam consistent for contact dermatitis.  Patient given Solu-Medrol and Atarax prior to departure.  Patient given prescription for Medrol Dosepak along with prescription for Atarax.  Patient consulted dermatology for definitive evaluation and treatment.      ____________________________________________   FINAL CLINICAL IMPRESSION(S) / ED DIAGNOSES  Final diagnoses:  Rash and nonspecific skin eruption     ED Discharge Orders          Ordered    methylPREDNISolone (MEDROL DOSEPAK) 4 MG TBPK tablet        04/06/21 0928    hydrOXYzine (ATARAX/VISTARIL) 50 MG tablet  3 times daily PRN        04/06/21 6578             Note:  This document was prepared using Dragon voice recognition software and may include unintentional dictation errors.    Joni Reining, PA-C 04/06/21 0935    Delton Prairie, MD 04/06/21 [phone]

## 2021-04-06 NOTE — Discharge Instructions (Addendum)
Read and follow discharge care instruction.  Follow-up with dermatology for definitive evaluation and treatment.

## 2021-04-06 NOTE — ED Triage Notes (Signed)
Pt reports for the past month she has been breaking out in a rash all over her body. Pt reports rash itches and she is not sure what is causing it. Denies SOB

## 2021-04-10 IMAGING — CR DG CHEST 2V
1 series · 2 of 2 positions shown · non-contrast
Comparison: 12/21/2017

CLINICAL DATA: Left chest pain

EXAM:
CHEST - 2 VIEW

[Series 1: dg chest 2 view · 0.14mm/px · 2 of 2 slices shown]
[im 1/2]
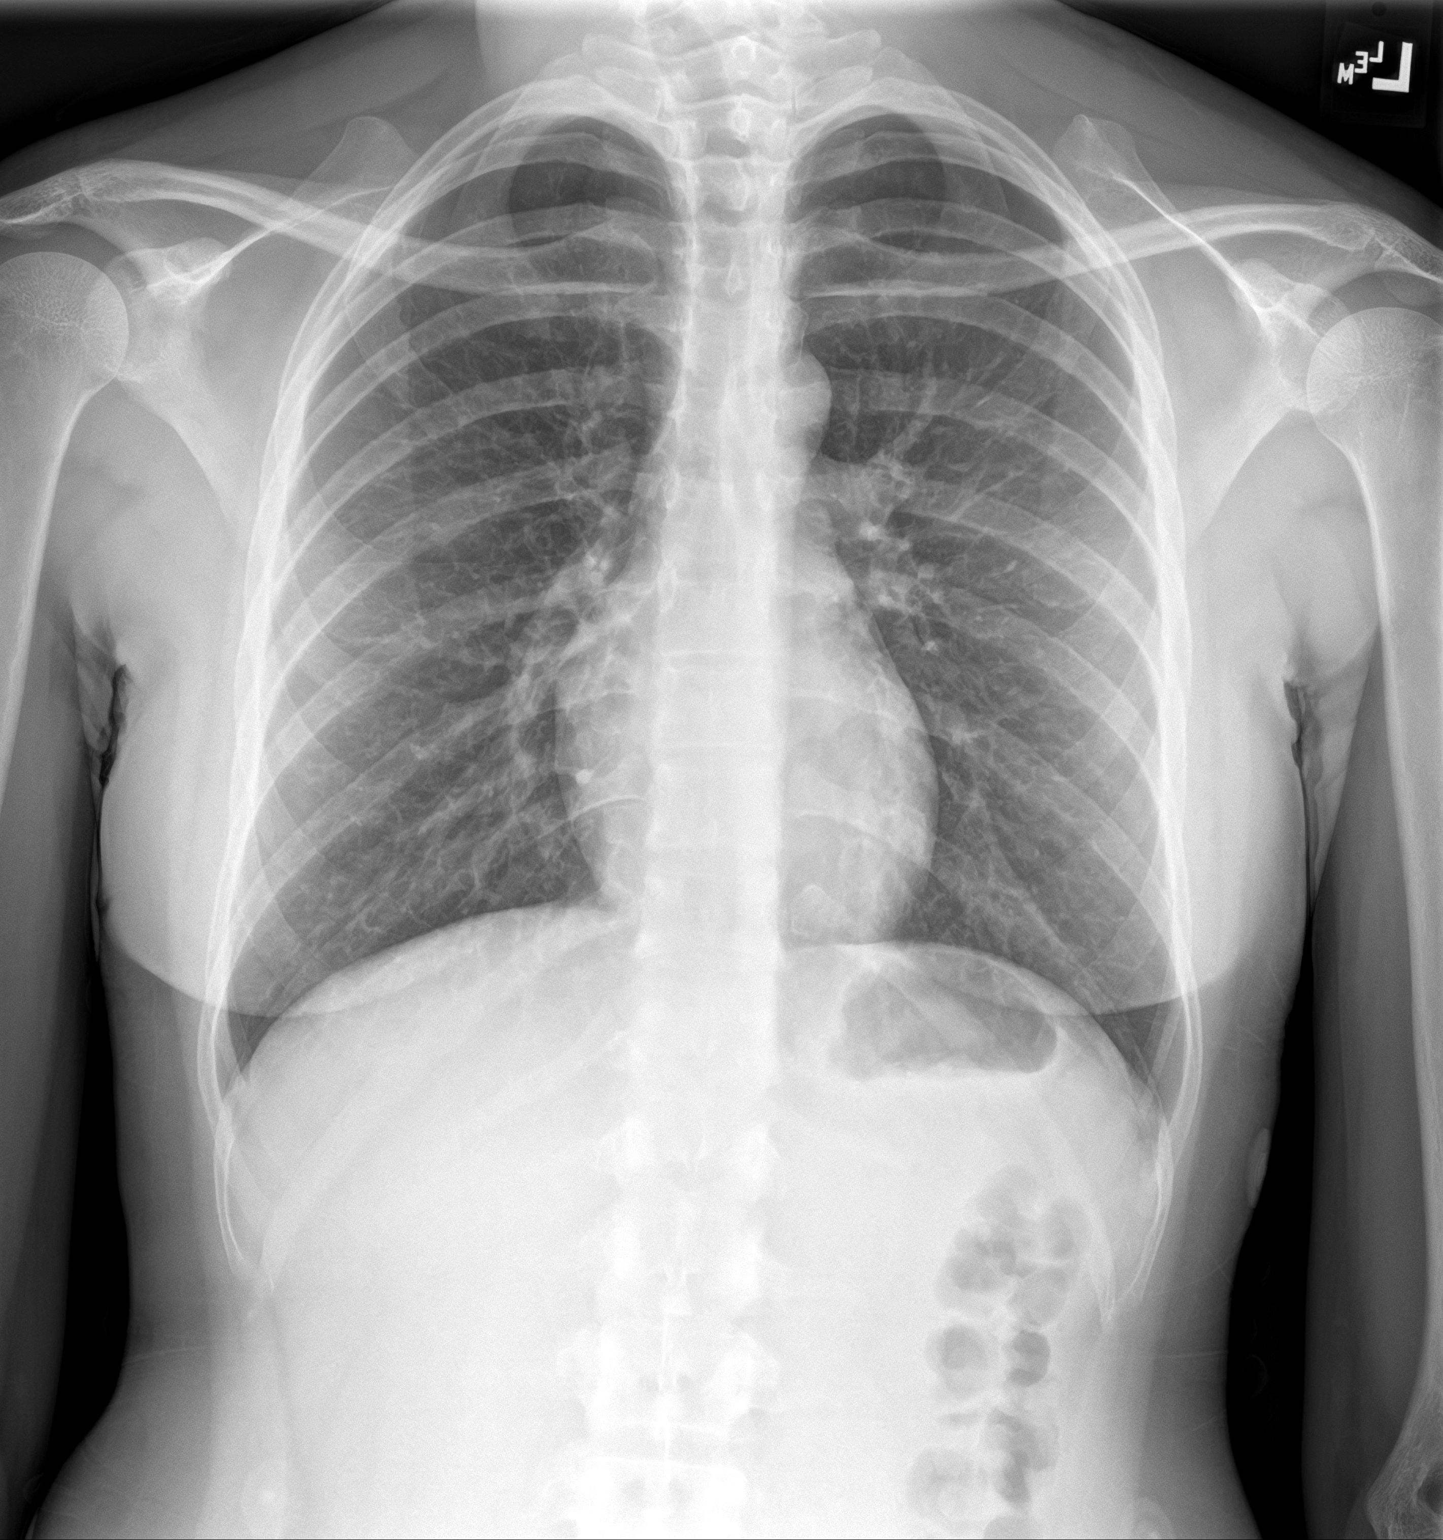
[im 2/2]
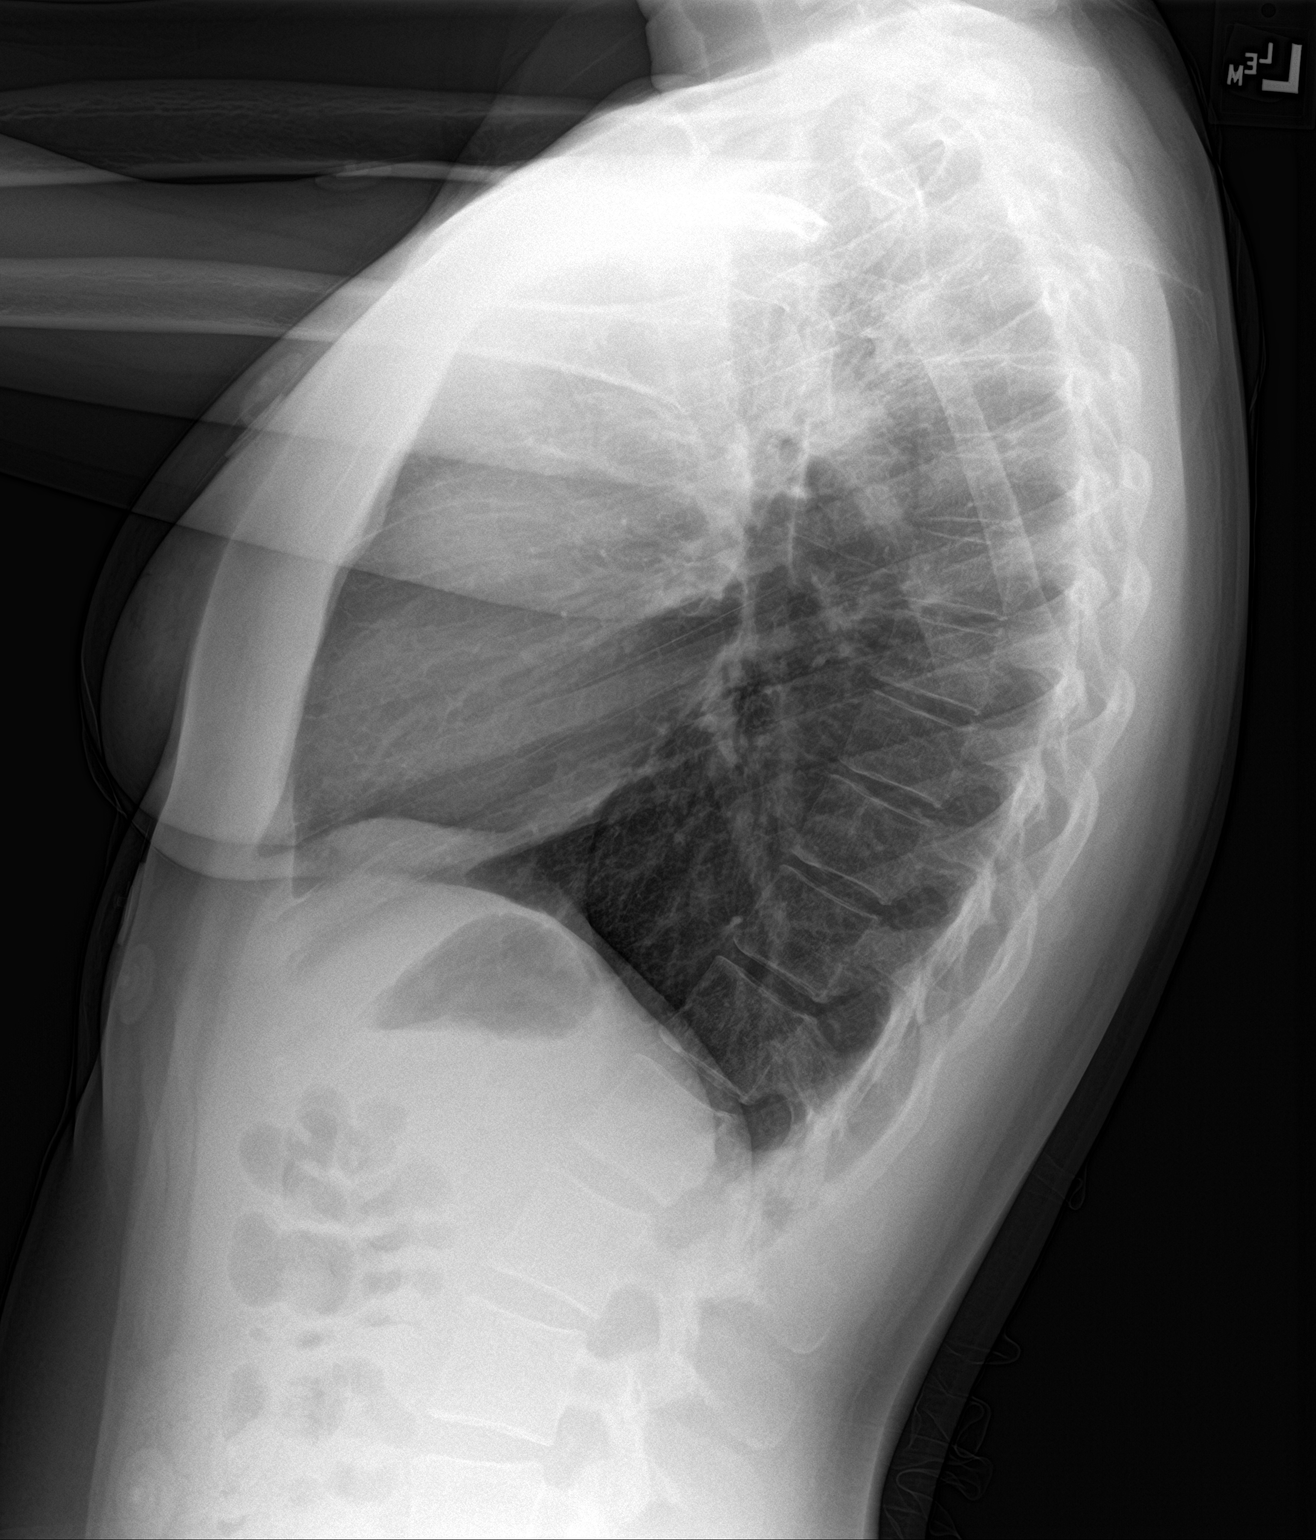

[2 of 2 positions shown; findings below may reference images not displayed]

FINDINGS: The heart size and mediastinal contours are within normal limits.
Both lungs are clear. The visualized skeletal structures are
unremarkable.
IMPRESSION: No active cardiopulmonary disease.

## 2021-07-13 ENCOUNTER — Other Ambulatory Visit: Payer: Self-pay

## 2021-07-13 ENCOUNTER — Emergency Department
Admission: EM | Admit: 2021-07-13 | Discharge: 2021-07-13 | Disposition: A | Discharge status: Home / Self Care | Payer: 59 | Attending: Emergency Medicine | Admitting: Emergency Medicine

## 2021-07-13 ENCOUNTER — Emergency Department: Payer: 59

## 2021-07-13 ENCOUNTER — Encounter: Payer: Self-pay | Admitting: Emergency Medicine

## 2021-07-13 DIAGNOSIS — R1030 Lower abdominal pain, unspecified: Secondary | ICD-10-CM | POA: Diagnosis not present

## 2021-07-13 DIAGNOSIS — Z3A01 Less than 8 weeks gestation of pregnancy: Secondary | ICD-10-CM | POA: Diagnosis not present

## 2021-07-13 DIAGNOSIS — M79642 Pain in left hand: Secondary | ICD-10-CM | POA: Diagnosis not present

## 2021-07-13 DIAGNOSIS — Y92410 Unspecified street and highway as the place of occurrence of the external cause: Secondary | ICD-10-CM | POA: Diagnosis not present

## 2021-07-13 DIAGNOSIS — O26891 Other specified pregnancy related conditions, first trimester: Secondary | ICD-10-CM | POA: Diagnosis not present

## 2021-07-13 LAB — COMPREHENSIVE METABOLIC PANEL
ALT: 14 U/L (ref 0–44)
AST: 17 U/L (ref 15–41)
Albumin: 4.7 g/dL (ref 3.5–5.0)
Alkaline Phosphatase: 57 U/L (ref 38–126)
Anion gap: 8 (ref 5–15)
BUN: 15 mg/dL (ref 6–20)
CO2: 24 mmol/L (ref 22–32)
Calcium: 9.2 mg/dL (ref 8.9–10.3)
Chloride: 103 mmol/L (ref 98–111)
Creatinine, Ser: 0.77 mg/dL (ref 0.44–1.00)
GFR, Estimated: >60 mL/min (ref >60)
Glucose, Bld: 84 mg/dL (ref 70–99)
Potassium: 3.6 mmol/L (ref 3.5–5.1)
Sodium: 135 mmol/L (ref 135–145)
Total Bilirubin: 1.9 mg/dL — ABNORMAL HIGH (ref 0.3–1.2)
Total Protein: 7.9 g/dL (ref 6.5–8.1)

## 2021-07-13 LAB — CBC WITH DIFFERENTIAL/PLATELET
Abs Immature Granulocytes: 0.03 10*3/uL (ref 0.00–0.07)
Basophils Absolute: 0 10*3/uL (ref 0.0–0.1)
Basophils Relative: 0 %
Eosinophils Absolute: 0.1 10*3/uL (ref 0.0–0.5)
Eosinophils Relative: 1 %
HCT: 37.6 % (ref 36.0–46.0)
Hemoglobin: 13.5 g/dL (ref 12.0–15.0)
Immature Granulocytes: 0 %
Lymphocytes Relative: 29 %
Lymphs Abs: 2.7 10*3/uL (ref 0.7–4.0)
MCH: 30.6 pg (ref 26.0–34.0)
MCHC: 35.9 g/dL (ref 30.0–36.0)
MCV: 85.3 fL (ref 80.0–100.0)
Monocytes Absolute: 0.6 10*3/uL (ref 0.1–1.0)
Monocytes Relative: 6 %
Neutro Abs: 5.9 10*3/uL (ref 1.7–7.7)
Neutrophils Relative %: 64 %
Platelets: 330 10*3/uL (ref 150–400)
RBC: 4.41 MIL/uL (ref 3.87–5.11)
RDW: 13 % (ref 11.5–15.5)
WBC: 9.3 10*3/uL (ref 4.0–10.5)
nRBC: 0 % (ref 0.0–0.2)

## 2021-07-13 LAB — URINALYSIS, COMPLETE (UACMP) WITH MICROSCOPIC
Bilirubin Urine: NEGATIVE
Glucose, UA: NEGATIVE mg/dL
Ketones, ur: NEGATIVE mg/dL
Nitrite: NEGATIVE
Protein, ur: NEGATIVE mg/dL
Specific Gravity, Urine: 1.012 (ref 1.005–1.030)
pH: 6 (ref 5.0–8.0)

## 2021-07-13 LAB — HCG, QUANTITATIVE, PREGNANCY: hCG, Beta Chain, Quant, S: 207 m[IU]/mL — ABNORMAL HIGH (ref <5)

## 2021-07-13 LAB — POC URINE PREG, ED: Preg Test, Ur: POSITIVE — AB
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
## 2021-07-13 NOTE — Discharge Instructions (Addendum)
It is OK to take over-the-counter Tylenol for pain  Your XRay was negative  Your ultrasound showed no signs of free fluid or injury, which is reassuring. Because you are so early in your pregnancy, we are unable to see anything on the ultrasound. This is likely due to being so early, but technically we cannot say your pregnancy is in the right place.  Call OBGYN in the next 24 hours to arrange follow-up and appropriate repeat lab work.   Return to the ER as needed.

## 2021-07-15 LAB — URINE CULTURE: Culture: <10000 — AB

## 2021-09-26 ENCOUNTER — Ambulatory Visit: Payer: Medicaid Other | Admitting: Dermatology

## 2021-10-30 ENCOUNTER — Telehealth: Payer: Self-pay | Admitting: Licensed Clinical Social Worker

## 2021-10-30 NOTE — Telephone Encounter (Signed)
-----   Message from Esmeralda Links, RN sent at 10/30/2021  2:40 PM EST ----- Regarding: Referral Marchelle Folks please accept this as a referral for the client attached. She is currently receiving OB care @ [facility], She states she is open to counseling services. If you have difficulty contacting her please let me know.    Thank you  Cassandra

## 2021-12-12 ENCOUNTER — Ambulatory Visit: Payer: Medicaid Other | Admitting: Dermatology

## 2024-01-25 ENCOUNTER — Ambulatory Visit: Payer: Self-pay | Admitting: Internal Medicine

## 2024-10-24 LAB — PANORAMA PRENATAL TEST FULL PANEL:PANORAMA TEST PLUS 5 ADDITIONAL MICRODELETIONS: FETAL FRACTION: 8.4

## 2024-11-11 ENCOUNTER — Ambulatory Visit: Payer: Self-pay | Admitting: Family Medicine

## 2024-11-11 ENCOUNTER — Encounter: Payer: Self-pay | Admitting: Family Medicine

## 2024-11-11 ENCOUNTER — Ambulatory Visit: Admitting: Family Medicine

## 2024-11-11 VITALS — BP 124/72 | HR 90 | Resp 16 | Ht 67.0 in | Wt 131.0 lb

## 2024-11-11 DIAGNOSIS — M545 Low back pain, unspecified: Secondary | ICD-10-CM

## 2024-11-11 DIAGNOSIS — R87619 Unspecified abnormal cytological findings in specimens from cervix uteri: Secondary | ICD-10-CM | POA: Insufficient documentation

## 2024-11-11 DIAGNOSIS — R87618 Other abnormal cytological findings on specimens from cervix uteri: Secondary | ICD-10-CM

## 2024-11-11 DIAGNOSIS — R110 Nausea: Secondary | ICD-10-CM

## 2024-11-11 DIAGNOSIS — J00 Acute nasopharyngitis [common cold]: Secondary | ICD-10-CM

## 2024-11-11 LAB — POCT URINE DIPSTICK
Bilirubin, UA: NEGATIVE
Blood, UA: NEGATIVE
Glucose, UA: NEGATIVE mg/dL
Leukocytes, UA: NEGATIVE
Nitrite, UA: NEGATIVE
Spec Grav, UA: 1.025
Urobilinogen, UA: 0.2 U/dL
pH, UA: 6.5

## 2024-11-11 NOTE — Progress Notes (Signed)
 "  New Patient Office Visit  Subjective    Patient ID: Kathryn Frost, female    DOB: [birth year]  Age: 34 y.o. MRN: [id number]  CC:  Chief Complaint  Patient presents with   Establish Care   Nausea    HPI Kathryn Frost presents to establish care. She is a pleasant 34 year old female seen today to establish care. She admits that she scheduled this appointment prior to finding out the she was pregnant. She reports EDD 05/20/25 and she is following with OB/GYN. She is established with [facility]. She has several concerns today and this includes recent abnormal pap. She voices when she went for one of her appointments with OB that her pap smear was abnormal. She voices due to abnormal pap they recommended proceeding with a colposcopy. She is concerned about doing this while pregnant. I advised that she discuss additional concerns, risk vs benefits, etc with her OB/GYN. She is receptive.   Initially when scheduling the appointment she had complaints of nausea, but voices over time this has improved. Suspects nausea s/t early pregnancy symptom. Denies vomiting. She describes nausea to be intermittent now, stating it comes and goes.  She also voices initially she had low back pain as well but this has also improved. Unsure if associated with nausea. She denies associated fever or chills. Denies dysuria, hematuria, or increased urinary frequency.  Due to pregnancy will proceed with urine dipstick and urine culture to ensure patient is without UTI. Urine dipstick in office negative for nitrites, leukocytes or presence of blood. As noted, sending urine culture for confirmation.  Advised she follow up with her OB regarding ongoing nausea.   She voices recently she has been experiencing increased sinus congestion, sinus pressure as well as rhinorrhea and sinus drainage. She reports associated intermittent headaches, as well as episodes of dizziness with headaches. She states that dizziness is  normally with position changes when headache is present. She denies falls or LOC. She voices she had discussed symptoms with her OB who had confirmed that she was ok to take OTC Robitussin with honey as needed for sinus congestion but only for a short period of time. She reports she saw some improvement in symptoms.      Outpatient Encounter Medications as of 11/11/2024  Medication Sig   [DISCONTINUED] famotidine  (PEPCID ) 20 MG tablet Take 1 tablet (20 mg total) by mouth 2 (two) times daily.   [DISCONTINUED] hydrOXYzine  (ATARAX /VISTARIL ) 50 MG tablet Take 1 tablet (50 mg total) by mouth 3 (three) times daily as needed.   [DISCONTINUED] methylPREDNISolone  (MEDROL  DOSEPAK) 4 MG TBPK tablet Take Tapered dose as directed   No facility-administered encounter medications on file as of 11/11/2024.    Past Medical History:  Diagnosis Date   Anemia    Meningitis     History reviewed. No pertinent surgical history.  Family History  Problem Relation Age of Onset   Healthy Mother    Healthy Father     Social History   Socioeconomic History   Marital status: Married    Spouse name: Not on file   Number of children: Not on file   Years of education: Not on file   Highest education level: Not on file  Occupational History   Not on file  Tobacco Use   Smoking status: Never   Smokeless tobacco: Never  Vaping Use   Vaping status: Never Used  Substance and Sexual Activity   Alcohol use: Not Currently  Drug use: Never   Sexual activity: Yes  Other Topics Concern   Not on file  Social History Narrative   Not on file   Social Drivers of Health   Tobacco Use: Low Risk (11/11/2024)   Patient History    Smoking Tobacco Use: Never    Smokeless Tobacco Use: Never    Passive Exposure: Not on file  Financial Resource Strain: Low Risk (11/11/2024)   Overall Financial Resource Strain (CARDIA)    Difficulty of Paying Living Expenses: Not hard at all  Food Insecurity: No Food Insecurity  (11/11/2024)   Epic    Worried About Programme Researcher, Broadcasting/film/video in the Last Year: Never true    Ran Out of Food in the Last Year: Never true  Transportation Needs: No Transportation Needs (11/11/2024)   Epic    Lack of Transportation (Medical): No    Lack of Transportation (Non-Medical): No  Physical Activity: Inactive (11/11/2024)   Exercise Vital Sign    Days of Exercise per Week: 0 days    Minutes of Exercise per Session: 0 min  Stress: No Stress Concern Present (11/11/2024)   [employer] of Occupational Health - Occupational Stress Questionnaire    Feeling of Stress: Not at all  Social Connections: Moderately Isolated (11/11/2024)   Social Connection and Isolation Panel    Frequency of Communication with Friends and Family: More than three times a week    Frequency of Social Gatherings with Friends and Family: Once a week    Attends Religious Services: Never    Database Administrator or Organizations: No    Attends Banker Meetings: Never    Marital Status: Married  Catering Manager Violence: Not At Risk (11/11/2024)   Epic    Fear of Current or Ex-Partner: No    Emotionally Abused: No    Physically Abused: No    Sexually Abused: No  Depression (PHQ2-9): Low Risk (11/11/2024)   Depression (PHQ2-9)    PHQ-2 Score: 0  Alcohol Screen: Low Risk (11/11/2024)   Alcohol Screen    Last Alcohol Screening Score (AUDIT): 0  Housing: Low Risk (11/11/2024)   Epic    Unable to Pay for Housing in the Last Year: No    Number of Times Moved in the Last Year: 0    Homeless in the Last Year: No  Utilities: Not At Risk (11/11/2024)   Epic    Threatened with loss of utilities: No  Health Literacy: Adequate Health Literacy (11/11/2024)   B1300 Health Literacy    Frequency of need for help with medical instructions: Never    Review of Systems  Constitutional:  Negative for chills and fever.  HENT:  Positive for congestion and sinus pain. Negative for ear pain and sore throat.    Cardiovascular:  Negative for leg swelling.  Gastrointestinal:  Positive for nausea. Negative for vomiting.  Genitourinary:  Negative for dysuria, frequency and hematuria.  Musculoskeletal:  Positive for back pain. Negative for falls.  Neurological:  Positive for dizziness and headaches. Negative for loss of consciousness.        Objective    BP 124/72   Pulse 90   Resp 16   Ht 5' 7 (1.702 m)   Wt 131 lb (59.4 kg)   SpO2 99%   BMI 20.52 kg/m   Physical Exam Constitutional:      Appearance: Normal appearance. She is not toxic-appearing.  HENT:     Right Ear: Tympanic membrane and ear canal normal.  Left Ear: Tympanic membrane and ear canal normal.     Nose: Mucosal edema and congestion present.     Mouth/Throat:     Pharynx: Oropharynx is clear. Uvula midline. No oropharyngeal exudate or posterior oropharyngeal erythema.  Eyes:     Conjunctiva/sclera: Conjunctivae normal.     Pupils: Pupils are equal, round, and reactive to light.  Cardiovascular:     Rate and Rhythm: Normal rate and regular rhythm.     Heart sounds: Normal heart sounds.  Pulmonary:     Effort: Pulmonary effort is normal. No respiratory distress.     Breath sounds: Normal breath sounds. No wheezing, rhonchi or rales.  Skin:    General: Skin is warm and dry.  Neurological:     Mental Status: She is alert and oriented to person, place, and time.  Psychiatric:        Mood and Affect: Mood normal.        Behavior: Behavior normal.        Assessment & Plan:   Assessment & Plan Acute rhinitis 34 year old female seen today to establish care with complaints of acute rhinitis. She describes symptoms including increased sinus congestion, sinus pressure, rhinorrhea and sinus drainage.   -Recommended starting OTC Budesonide nasal spray with instructions to use 1 spray in each nostril once to twice daily as needed for sinus congestion  -Recommended starting OTC Claritin once daily as needed for  rhinitis symptoms  -Recommended use of humidifier at bedtime     Nausea Complaints of intermittent nausea without vomiting.  Urine dipstick negative for nitrites, leukocytes or hematuria. Hold on abx therapy.  Sending urine culture for confirmation.   Advised that she follow up with her OB for ongoing concerns of nausea.  Will return to office for f/u after delivery. She voices understanding.  Orders:   POCT URINE DIPSTICK   Urine Culture  Low back pain, unspecified back pain laterality, unspecified chronicity, unspecified whether sciatica present Low back pain, see HPI for additional details.   Urine dipstick negative nitrites, leukocytes, hematuria. Send out urine culture for confirmation, pending.   Advised to maintain follow up with OB for ongoing concerns and evaluation of low back pain.  Orders:   POCT URINE DIPSTICK   Urine Culture  Other abnormal cytological finding of specimen from cervix Advised to follow up with OB/GYN for ongoing management of abnormal pap.  Last pap abnormal, last pap 10/2024        Return in about 8 months (around 07/12/2025).   LAYMON LOISE CORE, FNP   "

## 2024-11-11 NOTE — Assessment & Plan Note (Signed)
 Advised to follow up with OB/GYN for ongoing management of abnormal pap.  Last pap abnormal, last pap 10/2024

## 2024-11-11 NOTE — Patient Instructions (Addendum)
 Start over the counter Budesonide nasal spray. One spray in each nostril once to twice a day as needed.   Start over the counter (Claritin) Loratadine 10mg  once daily as needed.

## 2024-11-13 LAB — URINE CULTURE
MICRO NUMBER:: 17532634
Result:: NO GROWTH
SPECIMEN QUALITY:: ADEQUATE

## 2025-07-14 ENCOUNTER — Ambulatory Visit: Admitting: Family Medicine
